# Patient Record
Sex: Male | Born: 1970 | Race: White | Hispanic: No | Smoking: Never smoker
Health system: Southern US, Community
[De-identification: ages and names within clinical notes are randomized; demographics above are authoritative.]

## PROBLEM LIST (undated history)

## (undated) DIAGNOSIS — E785 Hyperlipidemia, unspecified: Secondary | ICD-10-CM

## (undated) DIAGNOSIS — I10 Essential (primary) hypertension: Secondary | ICD-10-CM

## (undated) DIAGNOSIS — E039 Hypothyroidism, unspecified: Secondary | ICD-10-CM

## (undated) DIAGNOSIS — K219 Gastro-esophageal reflux disease without esophagitis: Secondary | ICD-10-CM

## (undated) DIAGNOSIS — E079 Disorder of thyroid, unspecified: Secondary | ICD-10-CM

## (undated) DIAGNOSIS — E669 Obesity, unspecified: Secondary | ICD-10-CM

## (undated) HISTORY — DX: Essential (primary) hypertension: I10

## (undated) HISTORY — DX: Disorder of thyroid, unspecified: E07.9

## (undated) HISTORY — DX: Gastro-esophageal reflux disease without esophagitis: K21.9

## (undated) HISTORY — DX: Hypothyroidism, unspecified: E03.9

## (undated) HISTORY — DX: Obesity, unspecified: E66.9

## (undated) HISTORY — DX: Hyperlipidemia, unspecified: E78.5

## (undated) HISTORY — PX: OTHER SURGICAL HISTORY: SHX169

---

## 2012-10-16 ENCOUNTER — Ambulatory Visit (INDEPENDENT_AMBULATORY_CARE_PROVIDER_SITE_OTHER): Payer: 59

## 2012-10-16 ENCOUNTER — Ambulatory Visit (INDEPENDENT_AMBULATORY_CARE_PROVIDER_SITE_OTHER): Payer: 59 | Admitting: Family Medicine

## 2012-10-16 ENCOUNTER — Telehealth: Payer: Self-pay | Admitting: Physician Assistant

## 2012-10-16 ENCOUNTER — Encounter: Payer: Self-pay | Admitting: Family Medicine

## 2012-10-16 VITALS — BP 116/74 | HR 65 | Temp 97.4°F | Ht 72.0 in | Wt 245.6 lb

## 2012-10-16 DIAGNOSIS — R059 Cough, unspecified: Secondary | ICD-10-CM | POA: Insufficient documentation

## 2012-10-16 DIAGNOSIS — K219 Gastro-esophageal reflux disease without esophagitis: Secondary | ICD-10-CM | POA: Insufficient documentation

## 2012-10-16 DIAGNOSIS — R05 Cough: Secondary | ICD-10-CM | POA: Insufficient documentation

## 2012-10-16 DIAGNOSIS — R062 Wheezing: Secondary | ICD-10-CM

## 2012-10-16 MED ORDER — BUDESONIDE-FORMOTEROL FUMARATE 160-4.5 MCG/ACT IN AERO: 2.0000 | INHALATION_SPRAY | Freq: Two times a day (BID) | RESPIRATORY_TRACT | Status: DC

## 2012-10-16 MED ORDER — PANTOPRAZOLE SODIUM 40 MG PO TBEC: 40.0000 mg | DELAYED_RELEASE_TABLET | Freq: Every day | ORAL | Status: DC

## 2012-10-16 NOTE — Telephone Encounter (Signed)
APPT MADE

## 2012-10-16 NOTE — Assessment & Plan Note (Signed)
Not controlled on present on Prilosec. Will change therapy.

## 2012-10-16 NOTE — Progress Notes (Signed)
Patient ID: Steve Conner, male   DOB: [birth year], 42 y.o.   MRN: [id number] SUBJECTIVE:   HPI: Cough Patient complains of nonproductive cough. Symptoms began a few days ago. Symptoms have been gradually worsening since that time.The cough is nonproductive and is aggravated by nothing. Associated symptoms include: heartburn. Patient does not have new pets. Patient does not have a history of asthma. Patient does not have a history of environmental allergens. Patient has not traveled recently. Patient does not have a history of smoking. Patient has not had a previous chest x-ray. Patient has not had a PPD done.  PMH/PSH: reviewed/updated in Epic  SH/FH: reviewed/updated in Epic  Allergies: reviewed/updated in Epic  Medications: reviewed/updated in Epic  Immunizations: reviewed/updated in Epic  ROS: Nil else.  OBJECTIVE:    On examination he appeared in good health and spirits. No distress. Anicteric, Acyanotic Vital signs as documented. BP 116/74  Pulse 65  Temp(Src) 97.4 F (36.3 C) (Oral)  Ht 6' (1.829 m)  Wt 245 lb 9.6 oz (111.403 kg)  BMI 33.3 kg/m2  SpO2 97%  Skin warm and dry and without overt rashes.  Head,Ears,Eyes,Throat: normal Neck without JVD.  Lungs :Coarse breath sounds.Scattered Expiratory wheezes. Good airflow. No Rales. Heart exam notable for regular rhythm, normal sounds and absence of murmurs, rubs or gallops. Abdomen unremarkable and without evidence of organomegaly, masses, or abdominal aortic enlargement. GU: Extremities nonedematous. Neurologic:nonfocal  ASSESSMENT:  Wheezing May be secondary to GERD Will treat with symbicort inhaler.  Cough Nocturnal cough suspicious for GERD.  GERD (gastroesophageal reflux disease) Not controlled on present on Prilosec. Will change therapy.    PLAN:  Orders Placed This Encounter  Procedures  . DG Chest 2 View    Standing Status: Future     Number of Occurrences: 1     Standing Expiration Date: 12/16/2013     Order Specific Question:  Reason for Exam (SYMPTOM  OR DIAGNOSIS REQUIRED)    Answer:  cough    Order Specific Question:  Preferred imaging location?    Answer:  Internal             [facility] reading (PRIMARY) by  Dr. Leodis Sias: Preliminary reading, peribronchial thickening, bronchitis.                                                Meds ordered this encounter  Medications  . DISCONTD: omeprazole (PRILOSEC OTC) 20 MG tablet    Sig: Take 20 mg by mouth daily.  . pantoprazole (PROTONIX) 40 MG tablet    Sig: Take 1 tablet (40 mg total) by mouth daily.    Dispense:  30 tablet    Refill:  3  . budesonide-formoterol (SYMBICORT) 160-4.5 MCG/ACT inhaler    Sig: Inhale 2 puffs into the lungs 2 (two) times daily.    Dispense:  1 Inhaler    Refill:  2  Discussed with patient about the aggressive treatment of GERD. Handout in AVS. GERD handout given. Demonstrated use of the inhaler.  RTC in 4 weeks for  Recheck and possible PFTs.  Truc Winfree P. Modesto Charon, M.D.

## 2012-10-16 NOTE — Patient Instructions (Signed)
Diet for Gastroesophageal Reflux Disease, Adult  Reflux (acid reflux) is when acid from your stomach flows up into the esophagus. When acid comes in contact with the esophagus, the acid causes irritation and soreness (inflammation) in the esophagus. When reflux happens often or so severely that it causes damage to the esophagus, it is called gastroesophageal reflux disease (GERD). Nutrition therapy can help ease the discomfort of GERD.  FOODS OR DRINKS TO AVOID OR LIMIT  · Smoking or chewing tobacco. Nicotine is one of the most potent stimulants to acid production in the gastrointestinal tract.  · Caffeinated and decaffeinated coffee and black tea.  · Regular or low-calorie carbonated beverages or energy drinks (caffeine-free carbonated beverages are allowed).    · Strong spices, such as black pepper, white pepper, red pepper, cayenne, curry powder, and chili powder.  · Peppermint or spearmint.  · Chocolate.  · High-fat foods, including meats and fried foods. Extra added fats including oils, butter, salad dressings, and nuts. Limit these to less than 8 tsp per day.  · Fruits and vegetables if they are not tolerated, such as citrus fruits or tomatoes.  · Alcohol.  · Any food that seems to aggravate your condition.  If you have questions regarding your diet, call your caregiver or a registered dietitian.  OTHER THINGS THAT MAY HELP GERD INCLUDE:   · Eating your meals slowly, in a relaxed setting.  · Eating 5 to 6 small meals per day instead of 3 large meals.  · Eliminating food for a period of time if it causes distress.  · Not lying down until 3 hours after eating a meal.  · Keeping the head of your bed raised 6 to 9 inches (15 to 23 cm) by using a foam wedge or blocks under the legs of the bed. Lying flat may make symptoms worse.  · Being physically active. Weight loss may be helpful in reducing reflux in overweight or obese adults.  · Wear loose fitting clothing  EXAMPLE MEAL PLAN  This meal plan is approximately  2,000 calories based on [employer]My[employer].gov meal planning guidelines.  Breakfast  · ½ cup cooked oatmeal.  · 1 cup strawberries.  · 1 cup low-fat milk.  · 1 oz almonds.  Snack  · 1 cup cucumber slices.  · 6 oz yogurt (made from low-fat or fat-free milk).  Lunch  · 2 slice whole-wheat bread.  · 2½ oz sliced turkey.  · 2 tsp mayonnaise.  · 1 cup blueberries.  · 1 cup snap peas.  Snack  · 6 whole-wheat crackers.  · 1 oz string cheese.  Dinner  · ½ cup brown rice.  · 1 cup mixed veggies.  · 1 tsp olive oil.  · 3 oz grilled fish.  Document Released: 07/01/2005 Document Revised: 09/23/2011 Document Reviewed: 05/17/2011  ExitCare® Patient Information ©2013 [employer].  Gastroesophageal Reflux Disease, Adult  Gastroesophageal reflux disease (GERD) happens when acid from your stomach flows up into the esophagus. When acid comes in contact with the esophagus, the acid causes soreness (inflammation) in the esophagus. Over time, GERD may create small holes (ulcers) in the lining of the esophagus.  CAUSES   · Increased body weight. This puts pressure on the stomach, making acid rise from the stomach into the esophagus.  · Smoking. This increases acid production in the stomach.  · Drinking alcohol. This causes decreased pressure in the lower esophageal sphincter (valve or ring of muscle between the esophagus and stomach), allowing acid from the stomach   into the esophagus.  · Late evening meals and a full stomach. This increases pressure and acid production in the stomach.  · A malformed lower esophageal sphincter.  Sometimes, no cause is found.  SYMPTOMS   · Burning pain in the lower part of the mid-chest behind the breastbone and in the mid-stomach area. This may occur twice a week or more often.  · Trouble swallowing.  · Sore throat.  · Dry cough.  · Asthma-like symptoms including chest tightness, shortness of breath, or wheezing.  DIAGNOSIS   Your caregiver may be able to diagnose GERD based on your symptoms. In some cases,  X-rays and other tests may be done to check for complications or to check the condition of your stomach and esophagus.  TREATMENT   Your caregiver may recommend over-the-counter or prescription medicines to help decrease acid production. Ask your caregiver before starting or adding any new medicines.   HOME CARE INSTRUCTIONS   · Change the factors that you can control. Ask your caregiver for guidance concerning weight loss, quitting smoking, and alcohol consumption.  · Avoid foods and drinks that make your symptoms worse, such as:  · Caffeine or alcoholic drinks.  · Chocolate.  · Peppermint or mint flavorings.  · Garlic and onions.  · Spicy foods.  · Citrus fruits, such as oranges, lemons, or limes.  · Tomato-based foods such as sauce, chili, salsa, and pizza.  · Fried and fatty foods.  · Avoid lying down for the 3 hours prior to your bedtime or prior to taking a nap.  · Eat small, frequent meals instead of large meals.  · Wear loose-fitting clothing. Do not wear anything tight around your waist that causes pressure on your stomach.  · Raise the head of your bed 6 to 8 inches with wood blocks to help you sleep. Extra pillows will not help.  · Only take over-the-counter or prescription medicines for pain, discomfort, or fever as directed by your caregiver.  · Do not take aspirin, ibuprofen, or other nonsteroidal anti-inflammatory drugs (NSAIDs).  SEEK IMMEDIATE MEDICAL CARE IF:   · You have pain in your arms, neck, jaw, teeth, or back.  · Your pain increases or changes in intensity or duration.  · You develop nausea, vomiting, or sweating (diaphoresis).  · You develop shortness of breath, or you faint.  · Your vomit is green, yellow, black, or looks like coffee grounds or blood.  · Your stool is red, bloody, or black.  These symptoms could be signs of other problems, such as heart disease, gastric bleeding, or esophageal bleeding.  MAKE SURE YOU:   · Understand these instructions.  · Will watch your  condition.  · Will get help right away if you are not doing well or get worse.  Document Released: 04/10/2005 Document Revised: 09/23/2011 Document Reviewed: 01/18/2011  ExitCare® Patient Information ©2013 [employer].

## 2012-10-16 NOTE — Assessment & Plan Note (Signed)
Nocturnal cough suspicious for GERD.

## 2012-10-16 NOTE — Assessment & Plan Note (Signed)
May be secondary to GERD Will treat with symbicort inhaler.

## 2012-11-17 ENCOUNTER — Ambulatory Visit: Payer: PRIVATE HEALTH INSURANCE | Admitting: Family Medicine

## 2015-10-10 ENCOUNTER — Encounter: Payer: Self-pay | Admitting: Family

## 2015-10-10 ENCOUNTER — Ambulatory Visit (INDEPENDENT_AMBULATORY_CARE_PROVIDER_SITE_OTHER): Payer: BLUE CROSS/BLUE SHIELD | Admitting: Family

## 2015-10-10 VITALS — BP 151/101 | HR 71 | Temp 97.6°F | Ht 72.0 in | Wt 248.4 lb

## 2015-10-10 DIAGNOSIS — I10 Essential (primary) hypertension: Secondary | ICD-10-CM | POA: Insufficient documentation

## 2015-10-10 DIAGNOSIS — Z114 Encounter for screening for human immunodeficiency virus [HIV]: Secondary | ICD-10-CM

## 2015-10-10 DIAGNOSIS — Z0000 Encounter for general adult medical examination without abnormal findings: Secondary | ICD-10-CM | POA: Diagnosis not present

## 2015-10-10 MED ORDER — LISINOPRIL-HYDROCHLOROTHIAZIDE 20-12.5 MG PO TABS: 1.0000 | ORAL_TABLET | Freq: Every day | ORAL | Status: DC

## 2015-10-10 NOTE — Progress Notes (Signed)
   Subjective:    Patient ID: Steve Conner, male    DOB: 07-Jul-1971, 45 y.o.   MRN: [id number]  Pt presents to the office today for CPE. PT states he was told at the Urgent Care that his BP was elevated.  Hypertension This is a new problem. The current episode started more than 1 month ago. The problem is unchanged. The problem is uncontrolled. Pertinent negatives include no anxiety, headaches, palpitations, peripheral edema or shortness of breath. Risk factors for coronary artery disease include male gender, obesity, sedentary lifestyle and family history. Past treatments include nothing. There is no history of kidney disease, CAD/MI, heart failure or a thyroid problem. There is no history of sleep apnea.      Review of Systems  Constitutional: Negative.   HENT: Negative.   Respiratory: Negative.  Negative for shortness of breath.   Cardiovascular: Negative.  Negative for palpitations.  Gastrointestinal: Negative.   Endocrine: Negative.   Genitourinary: Negative.   Musculoskeletal: Negative.   Neurological: Negative.  Negative for headaches.  Hematological: Negative.   Psychiatric/Behavioral: Negative.   All other systems reviewed and are negative.      Objective:   Physical Exam  Constitutional: He is oriented to person, place, and time. He appears well-developed and well-nourished. No distress.  HENT:  Head: Normocephalic.  Right Ear: External ear normal.  Left Ear: External ear normal.  Nose: Nose normal.  Mouth/Throat: Oropharynx is clear and moist.  Eyes: Pupils are equal, round, and reactive to light. Right eye exhibits no discharge. Left eye exhibits no discharge.  Neck: Normal range of motion. Neck supple. No thyromegaly present.  Cardiovascular: Normal rate, regular rhythm, normal heart sounds and intact distal pulses.   No murmur heard. Pulmonary/Chest: Effort normal and breath sounds normal. No respiratory distress. He has no wheezes.  Abdominal: Soft. Bowel sounds  are normal. He exhibits no distension. There is no tenderness.  Musculoskeletal: Normal range of motion. He exhibits no edema or tenderness.  Neurological: He is alert and oriented to person, place, and time. He has normal reflexes. No cranial nerve deficit.  Skin: Skin is warm and dry. No rash noted. No erythema.  Psychiatric: He has a normal mood and affect. His behavior is normal. Judgment and thought content normal.  Vitals reviewed.    BP 151/101 mmHg  Pulse 71  Temp(Src) 97.6 F (36.4 C) (Oral)  Ht 6' (1.829 m)  Wt 248 lb 6.4 oz (112.674 kg)  BMI 33.68 kg/m2      Assessment & Plan:  1. Essential hypertension -Pt started on Zestoretic 20-12 mg today -Dash diet information given -Exercise encouraged - Stress Management  -Continue current meds -RTO in 2 weeks  - CMP14+EGFR - lisinopril-hydrochlorothiazide (ZESTORETIC) 20-12.5 MG tablet; Take 1 tablet by mouth daily.  Dispense: 90 tablet; Refill: 3  2. Annual physical exam - Anemia Profile B - CMP14+EGFR - Lipid panel - Thyroid Panel With TSH - PSA, total and free - VITAMIN D 25 Hydroxy (Vit-D Deficiency, Fractures) - HIV antibody  3. Screening for HIV (human immunodeficiency virus) - HIV antibody   Continue all meds Labs pending Health Maintenance reviewed Diet and exercise encouraged RTO 2 week to recheck HTN  Evelina Dun, [name]

## 2015-10-10 NOTE — Patient Instructions (Signed)

## 2015-10-11 LAB — CMP14+EGFR
ALT: 36 IU/L (ref 0–44)
AST: 20 IU/L (ref 0–40)
Albumin/Globulin Ratio: 1.6 (ref 1.2–2.2)
Albumin: 4.3 g/dL (ref 3.5–5.5)
Alkaline Phosphatase: 110 IU/L (ref 39–117)
BUN/Creatinine Ratio: 20 (ref 9–20)
BUN: 18 mg/dL (ref 6–24)
Bilirubin Total: 0.4 mg/dL (ref 0.0–1.2)
CALCIUM: 9.1 mg/dL (ref 8.7–10.2)
CO2: 22 mmol/L (ref 18–29)
CREATININE: 0.92 mg/dL (ref 0.76–1.27)
Chloride: 100 mmol/L (ref 96–106)
GFR calc Af Amer: 118 mL/min/{1.73_m2} (ref >59)
GFR, EST NON AFRICAN AMERICAN: 102 mL/min/{1.73_m2} (ref >59)
Globulin, Total: 2.7 g/dL (ref 1.5–4.5)
Glucose: 102 mg/dL — ABNORMAL HIGH (ref 65–99)
Potassium: 3.7 mmol/L (ref 3.5–5.2)
Sodium: 142 mmol/L (ref 134–144)
Total Protein: 7 g/dL (ref 6.0–8.5)

## 2015-10-11 LAB — ANEMIA PROFILE B
Basophils Absolute: 0 10*3/uL (ref 0.0–0.2)
Basos: 0 %
EOS (ABSOLUTE): 0.2 10*3/uL (ref 0.0–0.4)
EOS: 3 %
Ferritin: 116 ng/mL (ref 30–400)
Folate: 12.2 ng/mL (ref >3.0)
HEMOGLOBIN: 13 g/dL (ref 12.6–17.7)
Hematocrit: 37.2 % — ABNORMAL LOW (ref 37.5–51.0)
IMMATURE GRANS (ABS): 0 10*3/uL (ref 0.0–0.1)
IMMATURE GRANULOCYTES: 0 %
IRON SATURATION: 13 % — AB (ref 15–55)
Iron: 50 ug/dL (ref 38–169)
Lymphocytes Absolute: 2.2 10*3/uL (ref 0.7–3.1)
Lymphs: 27 %
MCH: 27.5 pg (ref 26.6–33.0)
MCHC: 34.9 g/dL (ref 31.5–35.7)
MCV: 79 fL (ref 79–97)
MONOCYTES: 7 %
MONOS ABS: 0.6 10*3/uL (ref 0.1–0.9)
Neutrophils Absolute: 4.9 10*3/uL (ref 1.4–7.0)
Neutrophils: 63 %
Platelets: 205 10*3/uL (ref 150–379)
RBC: 4.73 x10E6/uL (ref 4.14–5.80)
RDW: 15.1 % (ref 12.3–15.4)
Retic Ct Pct: 1.9 % (ref 0.6–2.6)
TIBC: 381 ug/dL (ref 250–450)
UIBC: 331 ug/dL (ref 111–343)
Vitamin B-12: 712 pg/mL (ref 211–946)
WBC: 7.9 10*3/uL (ref 3.4–10.8)

## 2015-10-11 LAB — PSA, TOTAL AND FREE
PSA FREE PCT: 16.2 %
PSA, Free: 0.21 ng/mL
Prostate Specific Ag, Serum: 1.3 ng/mL (ref 0.0–4.0)

## 2015-10-11 LAB — VITAMIN D 25 HYDROXY (VIT D DEFICIENCY, FRACTURES): VIT D 25 HYDROXY: 19.9 ng/mL — AB (ref 30.0–100.0)

## 2015-10-11 LAB — LIPID PANEL
CHOL/HDL RATIO: 13.2 ratio — AB (ref 0.0–5.0)
Cholesterol, Total: 225 mg/dL — ABNORMAL HIGH (ref 100–199)
HDL: 17 mg/dL — AB (ref >39)
TRIGLYCERIDES: 969 mg/dL — AB (ref 0–149)

## 2015-10-11 LAB — THYROID PANEL WITH TSH
Free Thyroxine Index: 1.3 (ref 1.2–4.9)
T3 Uptake Ratio: 25 % (ref 24–39)
T4, Total: 5.3 ug/dL (ref 4.5–12.0)
TSH: 4.59 u[IU]/mL — ABNORMAL HIGH (ref 0.450–4.500)

## 2015-10-11 LAB — HIV ANTIBODY (ROUTINE TESTING W REFLEX): HIV Screen 4th Generation wRfx: NONREACTIVE

## 2015-10-12 ENCOUNTER — Other Ambulatory Visit: Payer: Self-pay | Admitting: Family

## 2015-10-12 DIAGNOSIS — E039 Hypothyroidism, unspecified: Secondary | ICD-10-CM | POA: Insufficient documentation

## 2015-10-12 DIAGNOSIS — E559 Vitamin D deficiency, unspecified: Secondary | ICD-10-CM | POA: Insufficient documentation

## 2015-10-12 DIAGNOSIS — E781 Pure hyperglyceridemia: Secondary | ICD-10-CM | POA: Insufficient documentation

## 2015-10-12 MED ORDER — LEVOTHYROXINE SODIUM 25 MCG PO TABS: 25.0000 ug | ORAL_TABLET | Freq: Every day | ORAL | Status: DC

## 2015-10-12 MED ORDER — ROSUVASTATIN CALCIUM 20 MG PO TABS: 20.0000 mg | ORAL_TABLET | Freq: Every day | ORAL | Status: DC

## 2015-10-12 MED ORDER — VITAMIN D (ERGOCALCIFEROL) 1.25 MG (50000 UNIT) PO CAPS: 50000.0000 [IU] | ORAL_CAPSULE | ORAL | Status: DC

## 2015-10-24 ENCOUNTER — Encounter: Payer: Self-pay | Admitting: *Deleted

## 2015-10-24 ENCOUNTER — Ambulatory Visit (INDEPENDENT_AMBULATORY_CARE_PROVIDER_SITE_OTHER): Payer: BLUE CROSS/BLUE SHIELD | Admitting: Family

## 2015-10-24 ENCOUNTER — Encounter: Payer: Self-pay | Admitting: Family

## 2015-10-24 VITALS — BP 127/80 | HR 64 | Temp 97.8°F | Ht 72.0 in | Wt 245.0 lb

## 2015-10-24 DIAGNOSIS — E669 Obesity, unspecified: Secondary | ICD-10-CM

## 2015-10-24 DIAGNOSIS — I10 Essential (primary) hypertension: Secondary | ICD-10-CM | POA: Diagnosis not present

## 2015-10-24 DIAGNOSIS — E66811 Obesity, class 1: Secondary | ICD-10-CM

## 2015-10-24 HISTORY — DX: Obesity, unspecified: E66.9

## 2015-10-24 HISTORY — DX: Obesity, class 1: E66.811

## 2015-10-24 NOTE — Progress Notes (Signed)
   Subjective:    Patient ID: Steve Conner, male    DOB: [birth year], 45 y.o.   MRN: [id number]  Pt presents to the office today to recheck HTN. Pt's BP is at goal today. Hypertension This is a chronic problem. The current episode started more than 1 year ago. The problem has been resolved since onset. The problem is controlled. Pertinent negatives include no anxiety, headaches, palpitations, peripheral edema or shortness of breath. Risk factors for coronary artery disease include dyslipidemia, obesity and smoking/tobacco exposure. Past treatments include ACE inhibitors and diuretics. The current treatment provides moderate improvement. There is no history of kidney disease, CAD/MI, heart failure, PVD or a thyroid problem. There is no history of sleep apnea.      Review of Systems  Constitutional: Negative.   HENT: Negative.   Respiratory: Negative.  Negative for shortness of breath.   Cardiovascular: Negative.  Negative for palpitations.  Gastrointestinal: Negative.   Endocrine: Negative.   Genitourinary: Negative.   Musculoskeletal: Negative.   Neurological: Negative.  Negative for headaches.  Hematological: Negative.   Psychiatric/Behavioral: Negative.   All other systems reviewed and are negative.      Objective:   Physical Exam  Constitutional: He is oriented to person, place, and time. He appears well-developed and well-nourished. No distress.  HENT:  Head: Normocephalic.  Right Ear: External ear normal.  Left Ear: External ear normal.  Nose: Nose normal.  Mouth/Throat: Oropharynx is clear and moist.  Eyes: Pupils are equal, round, and reactive to light. Right eye exhibits no discharge. Left eye exhibits no discharge.  Neck: Normal range of motion. Neck supple. No thyromegaly present.  Cardiovascular: Normal rate, regular rhythm, normal heart sounds and intact distal pulses.   No murmur heard. Pulmonary/Chest: Effort normal and breath sounds normal. No respiratory  distress. He has no wheezes.  Abdominal: Soft. Bowel sounds are normal. He exhibits no distension. There is no tenderness.  Musculoskeletal: Normal range of motion. He exhibits no edema or tenderness.  Neurological: He is alert and oriented to person, place, and time. He has normal reflexes. No cranial nerve deficit.  Skin: Skin is warm and dry. No rash noted. No erythema.  Psychiatric: He has a normal mood and affect. His behavior is normal. Judgment and thought content normal.  Vitals reviewed.    BP 127/80 mmHg  Pulse 64  Temp(Src) 97.8 F (36.6 C) (Oral)  Ht 6' (1.829 m)  Wt 245 lb (111.131 kg)  BMI 33.22 kg/m2      Assessment & Plan:  1. Essential hypertension -Dash diet information given -Exercise encouraged - Stress Management  -Continue current meds - BMP8+EGFR  2. Obesity (BMI 30.0-34.9)   PT to RTO in 2 months to recheck thyroid and cholesterol levels.  Evelina Dun, [name]

## 2015-10-24 NOTE — Patient Instructions (Signed)
DASH Eating Plan °DASH stands for "Dietary Approaches to Stop Hypertension." The DASH eating plan is a healthy eating plan that has been shown to reduce high blood pressure (hypertension). Additional health benefits may include reducing the risk of type 2 diabetes mellitus, heart disease, and stroke. The DASH eating plan may also help with weight loss. °WHAT DO I NEED TO KNOW ABOUT THE DASH EATING PLAN? °For the DASH eating plan, you will follow these general guidelines: °· Choose foods with a percent daily value for sodium of less than 5% (as listed on the food label). °· Use salt-free seasonings or herbs instead of table salt or sea salt. °· Check with your health care provider or pharmacist before using salt substitutes. °· Eat lower-sodium products, often labeled as "lower sodium" or "no salt added." °· Eat fresh foods. °· Eat more vegetables, fruits, and low-fat dairy products. °· Choose whole grains. Look for the word "whole" as the first word in the ingredient list. °· Choose fish and skinless chicken or turkey more often than red meat. Limit fish, poultry, and meat to 6 oz (170 g) each day. °· Limit sweets, desserts, sugars, and sugary drinks. °· Choose heart-healthy fats. °· Limit cheese to 1 oz (28 g) per day. °· Eat more home-cooked food and less restaurant, buffet, and fast food. °· Limit fried foods. °· Cook foods using methods other than frying. °· Limit canned vegetables. If you do use them, rinse them well to decrease the sodium. °· When eating at a restaurant, ask that your food be prepared with less salt, or no salt if possible. °WHAT FOODS CAN I EAT? °Seek help from a dietitian for individual calorie needs. °Grains °Whole grain or whole wheat bread. Brown rice. Whole grain or whole wheat pasta. Quinoa, bulgur, and whole grain cereals. Low-sodium cereals. Corn or whole wheat flour tortillas. Whole grain cornbread. Whole grain crackers. Low-sodium crackers. °Vegetables °Fresh or frozen vegetables  (raw, steamed, roasted, or grilled). Low-sodium or reduced-sodium tomato and vegetable juices. Low-sodium or reduced-sodium tomato sauce and paste. Low-sodium or reduced-sodium canned vegetables.  °Fruits °All fresh, canned (in natural juice), or frozen fruits. °Meat and Other Protein Products °Ground beef (85% or leaner), grass-fed beef, or beef trimmed of fat. Skinless chicken or turkey. Ground chicken or turkey. Pork trimmed of fat. All fish and seafood. Eggs. Dried beans, peas, or lentils. Unsalted nuts and seeds. Unsalted canned beans. °Dairy °Low-fat dairy products, such as skim or 1% milk, 2% or reduced-fat cheeses, low-fat ricotta or cottage cheese, or plain low-fat yogurt. Low-sodium or reduced-sodium cheeses. °Fats and Oils °Tub margarines without trans fats. Light or reduced-fat mayonnaise and salad dressings (reduced sodium). Avocado. Safflower, olive, or canola oils. Natural peanut or almond butter. °Other °Unsalted popcorn and pretzels. °The items listed above may not be a complete list of recommended foods or beverages. Contact your dietitian for more options. °WHAT FOODS ARE NOT RECOMMENDED? °Grains °White bread. White pasta. White rice. Refined cornbread. Bagels and croissants. Crackers that contain trans fat. °Vegetables °Creamed or fried vegetables. Vegetables in a cheese sauce. Regular canned vegetables. Regular canned tomato sauce and paste. Regular tomato and vegetable juices. °Fruits °Dried fruits. Canned fruit in light or heavy syrup. Fruit juice. °Meat and Other Protein Products °Fatty cuts of meat. Ribs, chicken wings, bacon, sausage, bologna, salami, chitterlings, fatback, hot dogs, bratwurst, and packaged luncheon meats. Salted nuts and seeds. Canned beans with salt. °Dairy °Whole or 2% milk, cream, half-and-half, and cream cheese. Whole-fat or sweetened yogurt. Full-fat   cheeses or blue cheese. Nondairy creamers and whipped toppings. Processed cheese, cheese spreads, or cheese  curds. °Condiments °Onion and garlic salt, seasoned salt, table salt, and sea salt. Canned and packaged gravies. Worcestershire sauce. Tartar sauce. Barbecue sauce. Teriyaki sauce. Soy sauce, including reduced sodium. Steak sauce. Fish sauce. Oyster sauce. Cocktail sauce. Horseradish. Ketchup and mustard. Meat flavorings and tenderizers. Bouillon cubes. Hot sauce. Tabasco sauce. Marinades. Taco seasonings. Relishes. °Fats and Oils °Butter, stick margarine, lard, shortening, ghee, and bacon fat. Coconut, palm kernel, or palm oils. Regular salad dressings. °Other °Pickles and olives. Salted popcorn and pretzels. °The items listed above may not be a complete list of foods and beverages to avoid. Contact your dietitian for more information. °WHERE CAN I FIND MORE INFORMATION? °[employer]: www.nhlbi.nih.gov/health/health-topics/topics/dash/ °  °This information is not intended to replace advice given to you by your health care provider. Make sure you discuss any questions you have with your health care provider. °  °Document Released: 06/20/2011 Document Revised: 07/22/2014 Document Reviewed: 05/05/2013 °Elsevier Interactive Patient Education ©2016 [employer] ° °Hypertension °Hypertension, commonly called high blood pressure, is when the force of blood pumping through your arteries is too strong. Your arteries are the blood vessels that carry blood from your heart throughout your body. A blood pressure reading consists of a higher number over a lower number, such as 110/72. The higher number (systolic) is the pressure inside your arteries when your heart pumps. The lower number (diastolic) is the pressure inside your arteries when your heart relaxes. Ideally you want your blood pressure below 120/80. °Hypertension forces your heart to work harder to pump blood. Your arteries may become narrow or stiff. Having untreated or uncontrolled hypertension can cause heart attack, stroke, kidney  disease, and other problems. °RISK FACTORS °Some risk factors for high blood pressure are controllable. Others are not.  °Risk factors you cannot control include:  °· Race. You may be at higher risk if you are African American. °· Age. Risk increases with age. °· Gender. Men are at higher risk than women before age 45 years. After age 65, women are at higher risk than men. °Risk factors you can control include: °· Not getting enough exercise or physical activity. °· Being overweight. °· Getting too much fat, sugar, calories, or salt in your diet. °· Drinking too much alcohol. °SIGNS AND SYMPTOMS °Hypertension does not usually cause signs or symptoms. Extremely high blood pressure (hypertensive crisis) may cause headache, anxiety, shortness of breath, and nosebleed. °DIAGNOSIS °To check if you have hypertension, your health care provider will measure your blood pressure while you are seated, with your arm held at the level of your heart. It should be measured at least twice using the same arm. Certain conditions can cause a difference in blood pressure between your right and left arms. A blood pressure reading that is higher than normal on one occasion does not mean that you need treatment. If it is not clear whether you have high blood pressure, you may be asked to return on a different day to have your blood pressure checked again. Or, you may be asked to monitor your blood pressure at home for 1 or more weeks. °TREATMENT °Treating high blood pressure includes making lifestyle changes and possibly taking medicine. Living a healthy lifestyle can help lower high blood pressure. You may need to change some of your habits. °Lifestyle changes may include: °· Following the DASH diet. This diet is high in fruits, vegetables, and whole   grains. It is low in salt, red meat, and added sugars. °· Keep your sodium intake below 2,300 mg per day. °· Getting at least 30-45 minutes of aerobic exercise at least 4 times per  week. °· Losing weight if necessary. °· Not smoking. °· Limiting alcoholic beverages. °· Learning ways to reduce stress. °Your health care provider may prescribe medicine if lifestyle changes are not enough to get your blood pressure under control, and if one of the following is true: °· You are 18-59 years of age and your systolic blood pressure is above 140. °· You are 60 years of age or older, and your systolic blood pressure is above 150. °· Your diastolic blood pressure is above 90. °· You have diabetes, and your systolic blood pressure is over 140 or your diastolic blood pressure is over 90. °· You have kidney disease and your blood pressure is above 140/90. °· You have heart disease and your blood pressure is above 140/90. °Your personal target blood pressure may vary depending on your medical conditions, your age, and other factors. °HOME CARE INSTRUCTIONS °· Have your blood pressure rechecked as directed by your health care provider.   °· Take medicines only as directed by your health care provider. Follow the directions carefully. Blood pressure medicines must be taken as prescribed. The medicine does not work as well when you skip doses. Skipping doses also puts you at risk for problems. °· Do not smoke.   °· Monitor your blood pressure at home as directed by your health care provider.  °SEEK MEDICAL CARE IF:  °· You think you are having a reaction to medicines taken. °· You have recurrent headaches or feel dizzy. °· You have swelling in your ankles. °· You have trouble with your vision. °SEEK IMMEDIATE MEDICAL CARE IF: °· You develop a severe headache or confusion. °· You have unusual weakness, numbness, or feel faint. °· You have severe chest or abdominal pain. °· You vomit repeatedly. °· You have trouble breathing. °MAKE SURE YOU:  °· Understand these instructions. °· Will watch your condition. °· Will get help right away if you are not doing well or get worse. °  °This information is not intended to  replace advice given to you by your health care provider. Make sure you discuss any questions you have with your health care provider. °  °Document Released: 07/01/2005 Document Revised: 11/15/2014 Document Reviewed: 04/23/2013 °Elsevier Interactive Patient Education ©2016 Elsevier Inc. ° °

## 2015-10-25 LAB — BMP8+EGFR
BUN / CREAT RATIO: 21 — AB (ref 9–20)
BUN: 21 mg/dL (ref 6–24)
CO2: 25 mmol/L (ref 18–29)
CREATININE: 0.99 mg/dL (ref 0.76–1.27)
Calcium: 9.6 mg/dL (ref 8.7–10.2)
Chloride: 99 mmol/L (ref 96–106)
GFR, EST AFRICAN AMERICAN: 108 mL/min/{1.73_m2} (ref >59)
GFR, EST NON AFRICAN AMERICAN: 94 mL/min/{1.73_m2} (ref >59)
Glucose: 85 mg/dL (ref 65–99)
POTASSIUM: 4 mmol/L (ref 3.5–5.2)
SODIUM: 142 mmol/L (ref 134–144)

## 2015-12-26 ENCOUNTER — Encounter: Payer: Self-pay | Admitting: Family

## 2015-12-26 ENCOUNTER — Ambulatory Visit (INDEPENDENT_AMBULATORY_CARE_PROVIDER_SITE_OTHER): Payer: BLUE CROSS/BLUE SHIELD | Admitting: Family

## 2015-12-26 VITALS — BP 126/86 | HR 66 | Temp 97.1°F | Ht 72.0 in | Wt 240.8 lb

## 2015-12-26 DIAGNOSIS — E559 Vitamin D deficiency, unspecified: Secondary | ICD-10-CM | POA: Diagnosis not present

## 2015-12-26 DIAGNOSIS — K219 Gastro-esophageal reflux disease without esophagitis: Secondary | ICD-10-CM

## 2015-12-26 DIAGNOSIS — E8881 Metabolic syndrome: Secondary | ICD-10-CM | POA: Diagnosis not present

## 2015-12-26 DIAGNOSIS — I10 Essential (primary) hypertension: Secondary | ICD-10-CM | POA: Diagnosis not present

## 2015-12-26 DIAGNOSIS — E669 Obesity, unspecified: Secondary | ICD-10-CM | POA: Diagnosis not present

## 2015-12-26 DIAGNOSIS — E039 Hypothyroidism, unspecified: Secondary | ICD-10-CM

## 2015-12-26 DIAGNOSIS — E781 Pure hyperglyceridemia: Secondary | ICD-10-CM

## 2015-12-26 NOTE — Progress Notes (Signed)
Subjective:    Patient ID: Steve Conner, male    DOB: 1970/08/23, 45 y.o.   MRN: [id number]  Pt presents to the office today for chronic follow up.  Hypertension This is a chronic problem. The current episode started more than 1 month ago. The problem has been resolved since onset. The problem is controlled. Pertinent negatives include no anxiety, headaches, palpitations, peripheral edema or shortness of breath. Risk factors for coronary artery disease include male gender, obesity, sedentary lifestyle and family history. Past treatments include ACE inhibitors and diuretics. There is no history of kidney disease, CAD/MI, heart failure or a thyroid problem. There is no history of sleep apnea.  Hyperlipidemia This is a chronic problem. The current episode started more than 1 month ago. The problem is uncontrolled. Recent lipid tests were reviewed and are high. He has no history of diabetes. Pertinent negatives include no shortness of breath. Current antihyperlipidemic treatment includes statins. The current treatment provides mild improvement of lipids. Risk factors for coronary artery disease include dyslipidemia, hypertension, male sex, obesity and a sedentary lifestyle.  Thyroid Problem Presents for follow-up visit. Patient reports no constipation, diarrhea, dry skin, fatigue, hoarse voice, leg swelling, palpitations or weight gain. Past treatments include levothyroxine. The treatment provided moderate relief. His past medical history is significant for hyperlipidemia. There is no history of diabetes or heart failure.  Gastroesophageal Reflux He reports no belching, no coughing, no heartburn or no hoarse voice. This is a chronic problem. The current episode started more than 1 year ago. The problem occurs rarely. The problem has been resolved. The symptoms are aggravated by lying down. Pertinent negatives include no fatigue. Risk factors include obesity. He has tried an antacid for the symptoms. The  treatment provided moderate relief.      Review of Systems  Constitutional: Negative.  Negative for weight gain and fatigue.  HENT: Negative.  Negative for hoarse voice.   Respiratory: Negative.  Negative for cough and shortness of breath.   Cardiovascular: Negative.  Negative for palpitations.  Gastrointestinal: Negative.  Negative for heartburn, diarrhea and constipation.  Endocrine: Negative.   Genitourinary: Negative.   Musculoskeletal: Negative.   Neurological: Negative.  Negative for headaches.  Hematological: Negative.   Psychiatric/Behavioral: Negative.   All other systems reviewed and are negative.      Objective:   Physical Exam  Constitutional: He is oriented to person, place, and time. He appears well-developed and well-nourished. No distress.  HENT:  Head: Normocephalic.  Right Ear: External ear normal.  Left Ear: External ear normal.  Nose: Nose normal.  Mouth/Throat: Oropharynx is clear and moist.  Eyes: Pupils are equal, round, and reactive to light. Right eye exhibits no discharge. Left eye exhibits no discharge.  Neck: Normal range of motion. Neck supple. No thyromegaly present.  Cardiovascular: Normal rate, regular rhythm, normal heart sounds and intact distal pulses.   No murmur heard. Pulmonary/Chest: Effort normal and breath sounds normal. No respiratory distress. He has no wheezes.  Abdominal: Soft. Bowel sounds are normal. He exhibits no distension. There is no tenderness.  Musculoskeletal: Normal range of motion. He exhibits no edema or tenderness.  Neurological: He is alert and oriented to person, place, and time. He has normal reflexes. No cranial nerve deficit.  Skin: Skin is warm and dry. No rash noted. No erythema.  Psychiatric: He has a normal mood and affect. His behavior is normal. Judgment and thought content normal.  Vitals reviewed.    BP 126/86 mmHg  Pulse 66  Temp(Src) 97.1 F (36.2 C) (Oral)  Ht 6' (1.829 m)  Wt 240 lb 12.8 oz  (109.226 kg)  BMI 32.65 kg/m2      Assessment & Plan:  1. Essential hypertension - CMP14+EGFR  2. Gastroesophageal reflux disease, esophagitis presence not specified - CMP14+EGFR  3. Hypothyroidism, unspecified hypothyroidism type - CMP14+EGFR - Thyroid Panel With TSH  4. Hypertriglyceridemia - CMP14+EGFR - Lipid panel  5. Vitamin D deficiency - CMP14+EGFR  6. Obesity (BMI 30.0-34.9) - HAL93+XTKW  7. Metabolic syndrome  - IOX73+ZHGD   Continue all meds Labs pending Health Maintenance reviewed Diet and exercise encouraged RTO 3 months  Evelina Dun, [name]

## 2015-12-26 NOTE — Patient Instructions (Signed)

## 2015-12-27 LAB — CMP14+EGFR
ALBUMIN: 4.4 g/dL (ref 3.5–5.5)
ALK PHOS: 106 IU/L (ref 39–117)
ALT: 54 IU/L — ABNORMAL HIGH (ref 0–44)
AST: 34 IU/L (ref 0–40)
Albumin/Globulin Ratio: 1.5 (ref 1.2–2.2)
BUN / CREAT RATIO: 19 (ref 9–20)
BUN: 18 mg/dL (ref 6–24)
Bilirubin Total: 0.4 mg/dL (ref 0.0–1.2)
CO2: 24 mmol/L (ref 18–29)
CREATININE: 0.96 mg/dL (ref 0.76–1.27)
Calcium: 9.3 mg/dL (ref 8.7–10.2)
Chloride: 98 mmol/L (ref 96–106)
GFR calc Af Amer: 112 mL/min/{1.73_m2} (ref >59)
GFR calc non Af Amer: 97 mL/min/{1.73_m2} (ref >59)
GLOBULIN, TOTAL: 3 g/dL (ref 1.5–4.5)
Glucose: 94 mg/dL (ref 65–99)
Potassium: 3.7 mmol/L (ref 3.5–5.2)
SODIUM: 141 mmol/L (ref 134–144)
Total Protein: 7.4 g/dL (ref 6.0–8.5)

## 2015-12-27 LAB — THYROID PANEL WITH TSH
Free Thyroxine Index: 1.4 (ref 1.2–4.9)
T3 UPTAKE RATIO: 24 % (ref 24–39)
T4 TOTAL: 5.8 ug/dL (ref 4.5–12.0)
TSH: 2.7 u[IU]/mL (ref 0.450–4.500)

## 2015-12-27 LAB — LIPID PANEL
CHOL/HDL RATIO: 9.4 ratio — AB (ref 0.0–5.0)
CHOLESTEROL TOTAL: 150 mg/dL (ref 100–199)
HDL: 16 mg/dL — ABNORMAL LOW (ref >39)
Triglycerides: 739 mg/dL (ref 0–149)

## 2015-12-28 ENCOUNTER — Other Ambulatory Visit: Payer: Self-pay | Admitting: Family

## 2015-12-28 MED ORDER — FENOFIBRATE 145 MG PO TABS: 145.0000 mg | ORAL_TABLET | Freq: Every day | ORAL | Status: DC

## 2016-01-09 NOTE — Addendum Note (Signed)
Addended by: Tamera PuntWRAY, Yari Szeliga S on: 01/09/2016 08:48 AM   Modules accepted: Orders

## 2016-01-25 ENCOUNTER — Ambulatory Visit (INDEPENDENT_AMBULATORY_CARE_PROVIDER_SITE_OTHER): Payer: BLUE CROSS/BLUE SHIELD

## 2016-01-25 DIAGNOSIS — E781 Pure hyperglyceridemia: Secondary | ICD-10-CM

## 2016-01-25 DIAGNOSIS — I10 Essential (primary) hypertension: Secondary | ICD-10-CM | POA: Diagnosis not present

## 2016-01-25 DIAGNOSIS — E8881 Metabolic syndrome: Secondary | ICD-10-CM | POA: Diagnosis not present

## 2016-01-27 LAB — EXERCISE TOLERANCE TEST
CHL RATE OF PERCEIVED EXERTION: 6
CSEPHR: 88 %
CSEPPHR: 157 {beats}/min
Estimated workload: 9.6 METS
Exercise duration (min): 8 min
Exercise duration (sec): 34 s
MPHR: 178 {beats}/min
Rest HR: 56 {beats}/min

## 2016-02-02 ENCOUNTER — Encounter: Payer: Self-pay | Admitting: Family

## 2016-02-19 ENCOUNTER — Encounter: Payer: Self-pay | Admitting: *Deleted

## 2016-03-28 ENCOUNTER — Encounter: Payer: Self-pay | Admitting: Family

## 2016-03-28 ENCOUNTER — Ambulatory Visit (INDEPENDENT_AMBULATORY_CARE_PROVIDER_SITE_OTHER): Payer: BLUE CROSS/BLUE SHIELD | Admitting: Family

## 2016-03-28 VITALS — BP 116/66 | HR 56 | Temp 97.5°F | Ht 72.0 in | Wt 233.2 lb

## 2016-03-28 DIAGNOSIS — I10 Essential (primary) hypertension: Secondary | ICD-10-CM | POA: Diagnosis not present

## 2016-03-28 DIAGNOSIS — E8881 Metabolic syndrome: Secondary | ICD-10-CM

## 2016-03-28 DIAGNOSIS — E66811 Obesity, class 1: Secondary | ICD-10-CM

## 2016-03-28 DIAGNOSIS — E781 Pure hyperglyceridemia: Secondary | ICD-10-CM | POA: Diagnosis not present

## 2016-03-28 DIAGNOSIS — E559 Vitamin D deficiency, unspecified: Secondary | ICD-10-CM | POA: Diagnosis not present

## 2016-03-28 DIAGNOSIS — K219 Gastro-esophageal reflux disease without esophagitis: Secondary | ICD-10-CM

## 2016-03-28 DIAGNOSIS — E039 Hypothyroidism, unspecified: Secondary | ICD-10-CM | POA: Diagnosis not present

## 2016-03-28 DIAGNOSIS — E669 Obesity, unspecified: Secondary | ICD-10-CM

## 2016-03-28 MED ORDER — PANTOPRAZOLE SODIUM 20 MG PO TBEC: 20.0000 mg | DELAYED_RELEASE_TABLET | Freq: Every day | ORAL | 1 refills | Status: DC

## 2016-03-28 NOTE — Patient Instructions (Addendum)
Gastroesophageal Reflux Disease, Adult Normally, food travels down the esophagus and stays in the stomach to be digested. However, when a person has gastroesophageal reflux disease (GERD), food and stomach acid move back up into the esophagus. When this happens, the esophagus becomes sore and inflamed. Over time, GERD can create small holes (ulcers) in the lining of the esophagus.  CAUSES This condition is caused by a problem with the muscle between the esophagus and the stomach (lower esophageal sphincter, or LES). Normally, the LES muscle closes after food passes through the esophagus to the stomach. When the LES is weakened or abnormal, it does not close properly, and that allows food and stomach acid to go back up into the esophagus. The LES can be weakened by certain dietary substances, medicines, and medical conditions, including:  Tobacco use.  Pregnancy.  Having a hiatal hernia.  Heavy alcohol use.  Certain foods and beverages, such as coffee, chocolate, onions, and peppermint. RISK FACTORS This condition is more likely to develop in:  People who have an increased body weight.  People who have connective tissue disorders.  People who use NSAID medicines. SYMPTOMS Symptoms of this condition include:  Heartburn.  Difficult or painful swallowing.  The feeling of having a lump in the throat.  Abitter taste in the mouth.  Bad breath.  Having a large amount of saliva.  Having an upset or bloated stomach.  Belching.  Chest pain.  Shortness of breath or wheezing.  Ongoing (chronic) cough or a night-time cough.  Wearing away of tooth enamel.  Weight loss. Different conditions can cause chest pain. Make sure to see your health care provider if you experience chest pain. DIAGNOSIS Your health care provider will take a medical history and perform a physical exam. To determine if you have mild or severe GERD, your health care provider may also monitor how you respond  to treatment. You may also have other tests, including:  An endoscopy toexamine your stomach and esophagus with a small camera.  A test thatmeasures the acidity level in your esophagus.  A test thatmeasures how much pressure is on your esophagus.  A barium swallow or modified barium swallow to show the shape, size, and functioning of your esophagus. TREATMENT The goal of treatment is to help relieve your symptoms and to prevent complications. Treatment for this condition may vary depending on how severe your symptoms are. Your health care provider may recommend:  Changes to your diet.  Medicine.  Surgery. HOME CARE INSTRUCTIONS Diet  Follow a diet as recommended by your health care provider. This may involve avoiding foods and drinks such as:  Coffee and tea (with or without caffeine).  Drinks that containalcohol.  Energy drinks and sports drinks.  Carbonated drinks or sodas.  Chocolate and cocoa.  Peppermint and mint flavorings.  Garlic and onions.  Horseradish.  Spicy and acidic foods, including peppers, chili powder, curry powder, vinegar, hot sauces, and barbecue sauce.  Citrus fruit juices and citrus fruits, such as oranges, lemons, and limes.  Tomato-based foods, such as red sauce, chili, salsa, and pizza with red sauce.  Fried and fatty foods, such as donuts, french fries, potato chips, and high-fat dressings.  High-fat meats, such as hot dogs and fatty cuts of red and white meats, such as rib eye steak, sausage, ham, and bacon.  High-fat dairy items, such as whole milk, butter, and cream cheese.  Eat small, frequent meals instead of large meals.  Avoid drinking large amounts of liquid with your   meals.  Avoid eating meals during the 2-3 hours before bedtime.  Avoid lying down right after you eat.  Do not exercise right after you eat. General Instructions  Pay attention to any changes in your symptoms.  Take over-the-counter and prescription  medicines only as told by your health care provider. Do not take aspirin, ibuprofen, or other NSAIDs unless your health care provider told you to do so.  Do not use any tobacco products, including cigarettes, chewing tobacco, and e-cigarettes. If you need help quitting, ask your health care provider.  Wear loose-fitting clothing. Do not wear anything tight around your waist that causes pressure on your abdomen.  Raise (elevate) the head of your bed 6 inches (15cm).  Try to reduce your stress, such as with yoga or meditation. If you need help reducing stress, ask your health care provider.  If you are overweight, reduce your weight to an amount that is healthy for you. Ask your health care provider for guidance about a safe weight loss goal.  Keep all follow-up visits as told by your health care provider. This is important. SEEK MEDICAL CARE IF:  You have new symptoms.  You have unexplained weight loss.  You have difficulty swallowing, or it hurts to swallow.  You have wheezing or a persistent cough.  Your symptoms do not improve with treatment.  You have a hoarse voice. SEEK IMMEDIATE MEDICAL CARE IF:  You have pain in your arms, neck, jaw, teeth, or back.  You feel sweaty, dizzy, or light-headed.  You have chest pain or shortness of breath.  You vomit and your vomit looks like blood or coffee grounds.  You faint.  Your stool is bloody or black.  You cannot swallow, drink, or eat.   This information is not intended to replace advice given to you by your health care provider. Make sure you discuss any questions you have with your health care provider.   Document Released: 04/10/2005 Document Revised: 03/22/2015 Document Reviewed: 10/26/2014 Elsevier Interactive Patient Education 2016 [employer] Inc[employer] Food Choices for Gastroesophageal Reflux Disease, Adult When you have gastroesophageal reflux disease (GERD), the foods you eat and your eating habits are very important.  Choosing the right foods can help ease the discomfort of GERD. WHAT GENERAL GUIDELINES DO I NEED TO FOLLOW?  Choose fruits, vegetables, whole grains, low-fat dairy products, and low-fat meat, fish, and poultry.  Limit fats such as oils, salad dressings, butter, nuts, and avocado.  Keep a food diary to identify foods that cause symptoms.  Avoid foods that cause reflux. These may be different for different people.  Eat frequent small meals instead of three large meals each day.  Eat your meals slowly, in a relaxed setting.  Limit fried foods.  Cook foods using methods other than frying.  Avoid drinking alcohol.  Avoid drinking large amounts of liquids with your meals.  Avoid bending over or lying down until 2-3 hours after eating. WHAT FOODS ARE NOT RECOMMENDED? The following are some foods and drinks that may worsen your symptoms: Vegetables Tomatoes. Tomato juice. Tomato and spaghetti sauce. Chili peppers. Onion and garlic. Horseradish. Fruits Oranges, grapefruit, and lemon (fruit and juice). Meats High-fat meats, fish, and poultry. This includes hot dogs, ribs, ham, sausage, salami, and bacon. Dairy Whole milk and chocolate milk. Sour cream. Cream. Butter. Ice cream. Cream cheese.  Beverages Coffee and tea, with or without caffeine. Carbonated beverages or energy drinks. Condiments Hot sauce. Barbecue sauce.  Sweets/Desserts Chocolate and cocoa. Donuts. Peppermint and spearmint.   Fats and Oils High-fat foods, including French fries and potato chips. Other Vinegar. Strong spices, such as black pepper, white pepper, red pepper, cayenne, curry powder, cloves, ginger, and chili powder. The items listed above may not be a complete list of foods and beverages to avoid. Contact your dietitian for more information.   This information is not intended to replace advice given to you by your health care provider. Make sure you discuss any questions you have with your health care  provider.   Document Released: 07/01/2005 Document Revised: 07/22/2014 Document Reviewed: 05/05/2013 Elsevier Interactive Patient Education 2016 [employer] Inc.  

## 2016-03-28 NOTE — Progress Notes (Signed)
Subjective:    Patient ID: Steve Conner, male    DOB: 02-07-1971, 45 y.o.   MRN: [id number]  Pt presents to the office today for chronic follow up.  Hypertension  This is a chronic problem. The current episode started more than 1 month ago. The problem has been resolved since onset. The problem is controlled. Pertinent negatives include no anxiety, headaches, palpitations, peripheral edema or shortness of breath. Risk factors for coronary artery disease include male gender, obesity, sedentary lifestyle and family history. Past treatments include ACE inhibitors and diuretics. There is no history of kidney disease, CAD/MI, CVA, heart failure or a thyroid problem. There is no history of sleep apnea.  Hyperlipidemia  This is a chronic problem. The current episode started more than 1 month ago. The problem is uncontrolled. Recent lipid tests were reviewed and are high. Exacerbating diseases include obesity. He has no history of diabetes. Pertinent negatives include no shortness of breath. Current antihyperlipidemic treatment includes statins. The current treatment provides mild improvement of lipids. Risk factors for coronary artery disease include dyslipidemia, hypertension, male sex, obesity and a sedentary lifestyle.  Thyroid Problem  Presents for follow-up visit. Patient reports no constipation, diarrhea, dry skin, fatigue, hoarse voice, leg swelling, palpitations or weight gain. Past treatments include levothyroxine. The treatment provided moderate relief. His past medical history is significant for hyperlipidemia. There is no history of diabetes or heart failure.  Gastroesophageal Reflux  He complains of choking and heartburn. He reports no belching, no coughing, no dysphagia or no hoarse voice. This is a chronic problem. The current episode started more than 1 year ago. The problem occurs rarely. The problem has been waxing and waning. The symptoms are aggravated by lying down. Pertinent negatives  include no fatigue. Risk factors include obesity. He has tried an antacid for the symptoms. The treatment provided moderate relief.  Metabolic Syndrome PT currently taking Crestor and Tricor. PT states he has eliminated  all fried foods. Pt's triglycerides were very elevated.    Review of Systems  Constitutional: Negative.  Negative for fatigue and weight gain.  HENT: Negative.  Negative for hoarse voice.   Respiratory: Positive for choking. Negative for cough and shortness of breath.   Cardiovascular: Negative.  Negative for palpitations.  Gastrointestinal: Positive for heartburn. Negative for constipation, diarrhea and dysphagia.  Endocrine: Negative.   Genitourinary: Negative.   Musculoskeletal: Negative.   Neurological: Negative.  Negative for headaches.  Hematological: Negative.   Psychiatric/Behavioral: Negative.   All other systems reviewed and are negative.      Objective:   Physical Exam  Constitutional: He is oriented to person, place, and time. He appears well-developed and well-nourished. No distress.  HENT:  Head: Normocephalic.  Right Ear: External ear normal.  Left Ear: External ear normal.  Nose: Nose normal.  Mouth/Throat: Oropharynx is clear and moist.  Eyes: Pupils are equal, round, and reactive to light. Right eye exhibits no discharge. Left eye exhibits no discharge.  Neck: Normal range of motion. Neck supple. No thyromegaly present.  Cardiovascular: Normal rate, regular rhythm, normal heart sounds and intact distal pulses.   No murmur heard. Pulmonary/Chest: Effort normal and breath sounds normal. No respiratory distress. He has no wheezes.  Abdominal: Soft. Bowel sounds are normal. He exhibits no distension. There is no tenderness.  Musculoskeletal: Normal range of motion. He exhibits no edema or tenderness.  Neurological: He is alert and oriented to person, place, and time. He has normal reflexes. No cranial nerve deficit.  Skin:  Skin is warm and dry. No  rash noted. No erythema.  Psychiatric: He has a normal mood and affect. His behavior is normal. Judgment and thought content normal.  Vitals reviewed.    BP 116/66 (BP Location: Right Arm, Patient Position: Sitting, Cuff Size: Large)   Pulse (!) 56   Temp 97.5 F (36.4 C) (Oral)   Ht 6' (1.829 m)   Wt 233 lb 3.2 oz (105.8 kg)   BMI 31.63 kg/m       Assessment & Plan:  1. Essential hypertension - CMP14+EGFR  2. Gastroesophageal reflux disease, esophagitis presence not specified -Diet discussed- Avoid fried, spicy, citrus foods, caffeine and alcohol -Do not eat 2-3 hours before bedtime -Encouraged small frequent meals -Avoid NSAID's - CMP14+EGFR - pantoprazole (PROTONIX) 20 MG tablet; Take 1 tablet (20 mg total) by mouth daily.  Dispense: 90 tablet; Refill: 1  3. Hypothyroidism, unspecified hypothyroidism type - CMP14+EGFR - Thyroid Panel With TSH  4. Hypertriglyceridemia - CMP14+EGFR - Lipid panel  5. Obesity (BMI 30.0-34.9) - CMP14+EGFR  6. Vitamin D deficiency - CMP14+EGFR - VITAMIN D 25 Hydroxy (Vit-D Deficiency, Fractures)  7. Metabolic syndrome  - SYV48+YOYO   Continue all meds Labs pending Health Maintenance reviewed Diet and exercise encouraged RTO 3 months  Evelina Dun, [name]

## 2016-03-29 ENCOUNTER — Ambulatory Visit: Payer: BLUE CROSS/BLUE SHIELD | Admitting: Family

## 2016-03-29 LAB — CMP14+EGFR
ALBUMIN: 4.4 g/dL (ref 3.5–5.5)
ALK PHOS: 59 IU/L (ref 39–117)
ALT: 25 IU/L (ref 0–44)
AST: 20 IU/L (ref 0–40)
Albumin/Globulin Ratio: 1.6 (ref 1.2–2.2)
BILIRUBIN TOTAL: 0.4 mg/dL (ref 0.0–1.2)
BUN / CREAT RATIO: 20 (ref 9–20)
BUN: 22 mg/dL (ref 6–24)
CHLORIDE: 100 mmol/L (ref 96–106)
CO2: 25 mmol/L (ref 18–29)
Calcium: 9.7 mg/dL (ref 8.7–10.2)
Creatinine, Ser: 1.11 mg/dL (ref 0.76–1.27)
GFR calc Af Amer: 94 mL/min/{1.73_m2} (ref >59)
GFR calc non Af Amer: 81 mL/min/{1.73_m2} (ref >59)
GLUCOSE: 100 mg/dL — AB (ref 65–99)
Globulin, Total: 2.8 g/dL (ref 1.5–4.5)
Potassium: 4 mmol/L (ref 3.5–5.2)
Sodium: 143 mmol/L (ref 134–144)
Total Protein: 7.2 g/dL (ref 6.0–8.5)

## 2016-03-29 LAB — VITAMIN D 25 HYDROXY (VIT D DEFICIENCY, FRACTURES): Vit D, 25-Hydroxy: 33.3 ng/mL (ref 30.0–100.0)

## 2016-03-29 LAB — LIPID PANEL
CHOL/HDL RATIO: 5.5 ratio — AB (ref 0.0–5.0)
Cholesterol, Total: 148 mg/dL (ref 100–199)
HDL: 27 mg/dL — AB (ref >39)
LDL CALC: 62 mg/dL (ref 0–99)
Triglycerides: 295 mg/dL — ABNORMAL HIGH (ref 0–149)
VLDL CHOLESTEROL CAL: 59 mg/dL — AB (ref 5–40)

## 2016-03-29 LAB — THYROID PANEL WITH TSH
Free Thyroxine Index: 1.5 (ref 1.2–4.9)
T3 Uptake Ratio: 25 % (ref 24–39)
T4 TOTAL: 6.1 ug/dL (ref 4.5–12.0)
TSH: 1.82 u[IU]/mL (ref 0.450–4.500)

## 2016-04-01 ENCOUNTER — Ambulatory Visit (INDEPENDENT_AMBULATORY_CARE_PROVIDER_SITE_OTHER): Payer: BLUE CROSS/BLUE SHIELD | Admitting: Pharmacist

## 2016-04-01 ENCOUNTER — Encounter: Payer: Self-pay | Admitting: Pharmacist

## 2016-04-01 VITALS — BP 118/70 | HR 78 | Ht 72.0 in | Wt 233.0 lb

## 2016-04-01 DIAGNOSIS — E781 Pure hyperglyceridemia: Secondary | ICD-10-CM

## 2016-04-01 DIAGNOSIS — E785 Hyperlipidemia, unspecified: Secondary | ICD-10-CM | POA: Diagnosis not present

## 2016-04-01 MED ORDER — FENOFIBRATE 145 MG PO TABS: 145.0000 mg | ORAL_TABLET | Freq: Every day | ORAL | 2 refills | Status: DC

## 2016-04-01 NOTE — Patient Instructions (Signed)
Can switch to over the counter Vitamin D - recommend 400 to 1000 IU - take 1 capsule a day or you can continue weekly vitamin D if this option is cheaper.

## 2016-04-01 NOTE — Progress Notes (Signed)
Subjective:    Steve Conner is a 45 y.o. male who presents for evaluation of dyslipidemia. The patient does not use medications that may worsen dyslipidemias (corticosteroids, progestins, anabolic steroids, diuretics, beta-blockers, amiodarone, cyclosporine, olanzapine). Exercise: rarely. Previous history of cardiac disease includes: None.  Cardiac Risk Factors Age > 45-male, > 55-male:  NO  Smoking:   NO  Sig. family hx of CHD*:  YES  +1  Hypertension:   YES  +1  Diabetes:   NO  HDL < 35:   YES  +1  HDL > 59:   NO  Total:  3   *Significant family history of CHD per NCEP = MI or sudden death at less than 1 year old in father or other 1st-degree male relative, or less than 43 year old in mother or  other 1st-degree male relative  The following portions of the patient's history were reviewed and updated as appropriate: allergies, current medications, past family history, past medical history, past social history, past surgical history and problem list.   Objective:      Vitals:   04/01/16 0831  BP: 118/70  Pulse: 78   Body mass index is 31.6 kg/m.    Lab Review Office Visit on 03/28/2016  Component Date Value  . Glucose 03/29/2016 100*  . BUN 03/29/2016 22   . Creatinine, Ser 03/29/2016 1.11   . GFR calc non Af Amer 03/29/2016 81   . GFR calc Af Amer 03/29/2016 94   . BUN/Creatinine Ratio 03/29/2016 20   . Sodium 03/29/2016 143   . Potassium 03/29/2016 4.0   . Chloride 03/29/2016 100   . CO2 03/29/2016 25   . Calcium 03/29/2016 9.7   . Total Protein 03/29/2016 7.2   . Albumin 03/29/2016 4.4   . Globulin, Total 03/29/2016 2.8   . Albumin/Globulin Ratio 03/29/2016 1.6   . Bilirubin Total 03/29/2016 0.4   . Alkaline Phosphatase 03/29/2016 59   . AST 03/29/2016 20   . ALT 03/29/2016 25   . Cholesterol, Total 03/29/2016 148   . Triglycerides 03/29/2016 295*  . HDL 03/29/2016 27*  . VLDL Cholesterol Cal 03/29/2016 59*  . LDL Calculated 03/29/2016 62   . Chol/HDL  Ratio 03/29/2016 5.5*  . TSH 03/29/2016 1.820   . T4, Total 03/29/2016 6.1   . T3 Uptake Ratio 03/29/2016 25   . Free Thyroxine Index 03/29/2016 1.5   . Vit D, 25-Hydroxy 03/29/2016 33.3   Appointment on 01/25/2016  Component Date Value  . Rest HR 01/27/2016 56   . Rest BP 01/27/2016 110/78   . Exercise duration (min) 01/27/2016 8   . Exercise duration (sec) 01/27/2016 34   . Estimated workload 01/27/2016 9.6   . Peak HR 01/27/2016 157   . Peak BP 01/27/2016 142/90   . MPHR 01/27/2016 178   . Percent HR 01/27/2016 88   . RPE 01/27/2016 6   Office Visit on 12/26/2015  Component Date Value  . Glucose 12/27/2015 94   . BUN 12/27/2015 18   . Creatinine, Ser 12/27/2015 0.96   . GFR calc non Af Amer 12/27/2015 97   . GFR calc Af Amer 12/27/2015 112   . BUN/Creatinine Ratio 12/27/2015 19   . Sodium 12/27/2015 141   . Potassium 12/27/2015 3.7   . Chloride 12/27/2015 98   . CO2 12/27/2015 24   . Calcium 12/27/2015 9.3   . Total Protein 12/27/2015 7.4   . Albumin 12/27/2015 4.4   . Globulin, Total 12/27/2015 3.0   .  Albumin/Globulin Ratio 12/27/2015 1.5   . Bilirubin Total 12/27/2015 0.4   . Alkaline Phosphatase 12/27/2015 106   . AST 12/27/2015 34   . ALT 12/27/2015 54*  . Cholesterol, Total 12/27/2015 150   . Triglycerides 12/27/2015 739*  . HDL 12/27/2015 16*  . VLDL Cholesterol Cal 12/27/2015 Comment   . LDL Calculated 12/27/2015 Comment   . Chol/HDL Ratio 12/27/2015 9.4*  . TSH 12/27/2015 2.700   . T4, Total 12/27/2015 5.8   . T3 Uptake Ratio 12/27/2015 24   . Free Thyroxine Index 12/27/2015 1.4   Office Visit on 10/24/2015  Component Date Value  . Glucose 10/25/2015 85   . BUN 10/25/2015 21   . Creatinine, Ser 10/25/2015 0.99   . GFR calc non Af Amer 10/25/2015 94   . GFR calc Af Amer 10/25/2015 108   . BUN/Creatinine Ratio 10/25/2015 21*  . Sodium 10/25/2015 142   . Potassium 10/25/2015 4.0   . Chloride 10/25/2015 99   . CO2 10/25/2015 25   . Calcium  10/25/2015 9.6   Office Visit on 10/10/2015  Component Date Value  . Total Iron Binding Capac* 10/11/2015 381   . UIBC 10/11/2015 331   . Iron 10/11/2015 50   . Iron Saturation 10/11/2015 13*  . Ferritin 10/11/2015 116   . Vitamin B-12 10/11/2015 712   . Folate 10/11/2015 12.2   . WBC 10/11/2015 7.9   . RBC 10/11/2015 4.73   . Hemoglobin 10/11/2015 13.0   . Hematocrit 10/11/2015 37.2*  . MCV 10/11/2015 79   . [facility] 10/11/2015 27.5   . MCHC 10/11/2015 34.9   . RDW 10/11/2015 15.1   . Platelets 10/11/2015 205   . Neutrophils 10/11/2015 63   . Lymphs 10/11/2015 27   . Monocytes 10/11/2015 7   . Eos 10/11/2015 3   . Basos 10/11/2015 0   . Neutrophils Absolute 10/11/2015 4.9   . Lymphocytes Absolute 10/11/2015 2.2   . Monocytes Absolute 10/11/2015 0.6   . EOS (ABSOLUTE) 10/11/2015 0.2   . Basophils Absolute 10/11/2015 0.0   . Immature Granulocytes 10/11/2015 0   . Immature Grans (Abs) 10/11/2015 0.0   . Retic Ct Pct 10/11/2015 1.9   . Glucose 10/11/2015 102*  . BUN 10/11/2015 18   . Creatinine, Ser 10/11/2015 0.92   . GFR calc non Af Amer 10/11/2015 102   . GFR calc Af Amer 10/11/2015 118   . BUN/Creatinine Ratio 10/11/2015 20   . Sodium 10/11/2015 142   . Potassium 10/11/2015 3.7   . Chloride 10/11/2015 100   . CO2 10/11/2015 22   . Calcium 10/11/2015 9.1   . Total Protein 10/11/2015 7.0   . Albumin 10/11/2015 4.3   . Globulin, Total 10/11/2015 2.7   . Albumin/Globulin Ratio 10/11/2015 1.6   . Bilirubin Total 10/11/2015 0.4   . Alkaline Phosphatase 10/11/2015 110   . AST 10/11/2015 20   . ALT 10/11/2015 36   . Cholesterol, Total 10/11/2015 225*  . Triglycerides 10/11/2015 969*  . HDL 10/11/2015 17*  . VLDL Cholesterol Cal 10/11/2015 Comment   . LDL Calculated 10/11/2015 Comment   . Chol/HDL Ratio 10/11/2015 13.2*  . TSH 10/11/2015 4.590*  . T4, Total 10/11/2015 5.3   . T3 Uptake Ratio 10/11/2015 25   . Free Thyroxine Index 10/11/2015 1.3   . Prostate Specific  Ag, Se* 10/11/2015 1.3   . PSA, Free 10/11/2015 0.21   . PSA, Free Pct 10/11/2015 16.2   . Vit D, 25-Hydroxy 10/11/2015  19.9*  . HIV Screen 4th Generatio* 10/11/2015 Non Reactive       Assessment:    Dyslipidemia as detailed above with 3 CHD risk factors using NCEP scheme above.  Target levels for LDL are: < 100 mg/dl (CHD or "CHD risk equivalent" is present)  LDL is goal target but Tg still slightly elevated  ASCVD risk = 7% with no treatment  ASCVD risk with treatment = 3%  Explained to the patient the respective contributions of genetics, diet, and exercise to lipid levels and the use of medication in severe cases which do not respond to lifestyle alteration. The patient's interest and motivation in making lifestyle changes seems good.     Plan:    The following changes are planned for the next 3 months, at which time the patient will return for repeat fasting lipids:  1. Dietary changes: discussed limiting fat and high sugar / CHO foods dueto continued elevated triglycerides.   2. Exercise changes:  Advised to engage in walking briskly,  frequency goal is 3-4 times a week 3. Other treatment Treatment of hypertension (continue lisinopril HCT) and continue treatment for hypothyroidism  4. Lipid-lowering medications: continue fenofibrate 145mg  qd and continue rosuvastatin 20mg  qd  Note: The majority of the visit was spent in counseling on the pathophysiology and treatment of dyslipidemias. The total face-to-face time was in excess of 30 minutes.    Patient ID: Pamalee LeydenKenny Favorite, male   DOB: 03-27-1971, 45 y.o.   MRN: [id number]

## 2016-04-10 ENCOUNTER — Other Ambulatory Visit: Payer: Self-pay | Admitting: Family

## 2016-04-16 ENCOUNTER — Telehealth: Payer: Self-pay | Admitting: Family

## 2016-04-16 NOTE — Telephone Encounter (Signed)
Please advise 

## 2016-04-17 MED ORDER — HYDROCHLOROTHIAZIDE 25 MG PO TABS: 25.0000 mg | ORAL_TABLET | Freq: Every day | ORAL | 3 refills | Status: DC

## 2016-04-17 NOTE — Telephone Encounter (Signed)
Patient aware, made apt for bp check 04/29/16 with [facility].

## 2016-04-17 NOTE — Telephone Encounter (Signed)
I sent in a new rx of just HCTZ 24 mg without lisinopril. Lisinopril added to allergy list. Pt will need to follow up in two to recheck BP.

## 2016-04-24 ENCOUNTER — Encounter: Payer: Self-pay | Admitting: Physician Assistant

## 2016-04-24 ENCOUNTER — Ambulatory Visit (INDEPENDENT_AMBULATORY_CARE_PROVIDER_SITE_OTHER): Payer: BLUE CROSS/BLUE SHIELD | Admitting: Physician Assistant

## 2016-04-24 VITALS — BP 113/69 | HR 58 | Temp 97.4°F | Ht 72.0 in | Wt 232.8 lb

## 2016-04-24 DIAGNOSIS — T783XXS Angioneurotic edema, sequela: Secondary | ICD-10-CM

## 2016-04-24 DIAGNOSIS — L509 Urticaria, unspecified: Secondary | ICD-10-CM | POA: Diagnosis not present

## 2016-04-24 MED ORDER — LORATADINE 10 MG PO TABS: 10.0000 mg | ORAL_TABLET | Freq: Every day | ORAL | 11 refills | Status: DC

## 2016-04-24 NOTE — Patient Instructions (Signed)
Angioedema Angioedema Angioedema is a sudden swelling of tissues, often of the skin. It can occur on the face or genitals or in the abdomen or other body parts. The swelling usually develops over a short period and gets better in 24 to 48 hours. It often begins during the night and is found when the person wakes up. The person may also get red, itchy patches of skin (hives). Angioedema can be dangerous if it involves swelling of the air passages.  Depending on the cause, episodes of angioedema may only happen once, come back in unpredictable patterns, or repeat for several years and then gradually fade away.  CAUSES  Angioedema can be caused by an allergic reaction to various triggers. It can also result from nonallergic causes, including reactions to drugs, immune system disorders, viral infections, or an abnormal gene that is passed to you from your parents (hereditary). For some people with angioedema, the cause is unknown.  Some things that can trigger angioedema include:   Foods.   Medicines, such as ACE inhibitors, ARBs, nonsteroidal anti-inflammatory agents, or estrogen.   Latex.   Animal saliva.   Insect stings.   Dyes used in X-rays.   Mild injury.   Dental work.  Surgery.  Stress.   Sudden changes in temperature.   Exercise. SIGNS AND SYMPTOMS   Swelling of the skin.  Hives. If these are present, there is also intense itching.  Redness in the affected area.   Pain in the affected area.  Swollen lips or tongue.  Breathing problems. This may happen if the air passages swell.  Wheezing. If internal organs are involved, there may be:   Nausea.   Abdominal pain.   Vomiting.   Difficulty swallowing.   Difficulty passing urine. DIAGNOSIS   Your health care provider will examine the affected area and take a medical and family history.  Various tests may be done to help determine the cause. Tests may include:  Allergy skin tests to see if  the problem is an allergic reaction.   Blood tests to check for hereditary angioedema.   Tests to check for underlying diseases that could cause the condition.   A review of your medicines, including over-the-counter medicines, may be done. TREATMENT  Treatment will depend on the cause of the angioedema. Possible treatments include:   Removal of anything that triggered the condition (such as stopping certain medicines).   Medicines to treat symptoms or prevent attacks. Medicines given may include:   Antihistamines.   Epinephrine injection.   Steroids.   Hospitalization may be required for severe attacks. If the air passages are affected, it can be an emergency. Tubes may need to be placed to keep the airway open. HOME CARE INSTRUCTIONS   Take all medicines as directed by your health care provider.  If you were given medicines for emergency allergy treatment, always carry them with you.  Wear a medical bracelet as directed by your health care provider.   Avoid known triggers. SEEK MEDICAL CARE IF:   You have repeat attacks of angioedema.   Your attacks are more frequent or more severe despite preventive measures.   You have hereditary angioedema and are considering having children. It is important to discuss with your health care provider the risks of passing the condition on to your children. SEEK IMMEDIATE MEDICAL CARE IF:   You have severe swelling of the mouth, tongue, or lips.  You have difficulty breathing.   You have difficulty swallowing.   You faint.  MAKE SURE YOU:  Understand these instructions.  Will watch your condition.  Will get help right away if you are not doing well or get worse.   This information is not intended to replace advice given to you by your health care provider. Make sure you discuss any questions you have with your health care provider.   Document Released: 09/09/2001 Document Revised: 07/22/2014 Document Reviewed:  02/22/2013 Elsevier Interactive Patient Education [employer]2016 [employer]vier Inc.

## 2016-04-26 NOTE — Progress Notes (Signed)
 BP 113/69   Pulse (!) 58   Temp 97.4 F (36.3 C) (Oral)   Ht 6' (1.829 m)   Wt 232 lb 12.8 oz (105.6 kg)   BMI 31.57 kg/m    Subjective:    Patient ID: Steve Conner, male    DOB: 05/15/1971, 44 y.o.   MRN: [id number]  HPI: Steve Conner is a 44 y.o. male presenting on 04/24/2016 for Urticaria  About 2 weeks ago the patient was seen through urgent care and told that he had edema or a reaction to his ACE inhibitor. He didn't stop the lisinopril. He was given a steroid shot and told to take Benadryl. He has not continued with any antihistamine at this time. In the past day he has had a recurrence of the hives on his arms. At today's visit and they are slightly decreased from where they were earlier in the day. 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 I have encouraged him to get on a daily thing and generation antihistamine such as Zyrtec or Claritin. He may possibly need to take a histamine 2 blocker. He is encouraged to take this for the next 3 months and then he can try to stop it and if the urticaria comes back he may need allergy evaluation.  Relevant past medical, surgical, family and social history reviewed and updated as indicated. Allergies and medications reviewed and updated.  Past Medical History:  Diagnosis Date  . GERD (gastroesophageal reflux disease)   . Hyperlipidemia   . Thyroid disease     History reviewed. No pertinent surgical history.  Review of Systems  Constitutional: Negative.  Negative for appetite change and fatigue.  HENT: Negative.   Eyes: Negative.  Negative for pain and visual disturbance.  Respiratory: Negative.  Negative for cough, chest tightness, shortness of breath and wheezing.   Cardiovascular: Negative.  Negative for chest pain, palpitations and leg swelling.  Gastrointestinal: Negative.  Negative for abdominal pain, diarrhea, nausea and vomiting.  Endocrine: Negative.   Genitourinary: Negative.    Musculoskeletal: Negative.   Skin: Positive for color change and rash.  Neurological: Negative.  Negative for weakness, numbness and headaches.  Psychiatric/Behavioral: Negative.       Medication List       Accurate as of 04/24/16 11:59 PM. Always use your most recent med list.          fenofibrate 145 MG tablet Commonly known as:  TRICOR Take 1 tablet (145 mg total) by mouth daily.   hydrochlorothiazide 25 MG tablet Commonly known as:  HYDRODIURIL Take 1 tablet (25 mg total) by mouth daily.   levothyroxine 25 MCG tablet Commonly known as:  SYNTHROID, LEVOTHROID TAKE 1 TABLET (25 MCG TOTAL) BY MOUTH DAILY BEFORE BREAKFAST.   loratadine 10 MG tablet Commonly known as:  CLARITIN Take 1 tablet (10 mg total) by mouth daily.   pantoprazole 20 MG tablet Commonly known as:  PROTONIX Take 1 tablet (20 mg total) by mouth daily.   rosuvastatin 20 MG tablet Commonly known as:  CRESTOR Take 1 tablet (20 mg total) by mouth daily.   Vitamin D (Ergocalciferol) 50000 units Caps capsule Commonly known as:  DRISDOL Take 1 capsule (50,000 Units total) by mouth every 7 (seven) days.          Objective:    BP 113/69   Pulse (!) 58   Temp 97.4 F (36.3 C) (Oral)   Ht 6' (1.829 m)     Wt 232 lb 12.8 oz (105.6 kg)   BMI 31.57 kg/m   Allergies  Allergen Reactions  . Lisinopril Swelling  . Penicillins     Physical Exam  Constitutional: He appears well-developed and well-nourished. No distress.  HENT:  Head: Normocephalic and atraumatic.  Eyes: Conjunctivae and EOM are normal. Pupils are equal, round, and reactive to light.  Cardiovascular: Normal rate, regular rhythm and normal heart sounds.   Pulmonary/Chest: Effort normal and breath sounds normal. No respiratory distress.  Skin: Skin is warm and dry. Rash noted. Rash is macular and urticarial. There is erythema.  Psychiatric: He has a normal mood and affect. His behavior is normal.  Nursing note and vitals  reviewed.   Results for orders placed or performed in visit on 03/28/16  CMP14+EGFR  Result Value Ref Range   Glucose 100 (H) 65 - 99 mg/dL   BUN 22 6 - 24 mg/dL   Creatinine, Ser 1.11 0.76 - 1.27 mg/dL   GFR calc non Af Amer 81 >59 mL/min/1.73   GFR calc Af Amer 94 >59 mL/min/1.73   BUN/Creatinine Ratio 20 9 - 20   Sodium 143 134 - 144 mmol/L   Potassium 4.0 3.5 - 5.2 mmol/L   Chloride 100 96 - 106 mmol/L   CO2 25 18 - 29 mmol/L   Calcium 9.7 8.7 - 10.2 mg/dL   Total Protein 7.2 6.0 - 8.5 g/dL   Albumin 4.4 3.5 - 5.5 g/dL   Globulin, Total 2.8 1.5 - 4.5 g/dL   Albumin/Globulin Ratio 1.6 1.2 - 2.2   Bilirubin Total 0.4 0.0 - 1.2 mg/dL   Alkaline Phosphatase 59 39 - 117 IU/L   AST 20 0 - 40 IU/L   ALT 25 0 - 44 IU/L  Lipid panel  Result Value Ref Range   Cholesterol, Total 148 100 - 199 mg/dL   Triglycerides 295 (H) 0 - 149 mg/dL   HDL 27 (L) >39 mg/dL   VLDL Cholesterol Cal 59 (H) 5 - 40 mg/dL   LDL Calculated 62 0 - 99 mg/dL   Chol/HDL Ratio 5.5 (H) 0.0 - 5.0 ratio units  Thyroid Panel With TSH  Result Value Ref Range   TSH 1.820 0.450 - 4.500 uIU/mL   T4, Total 6.1 4.5 - 12.0 ug/dL   T3 Uptake Ratio 25 24 - 39 %   Free Thyroxine Index 1.5 1.2 - 4.9  VITAMIN D 25 Hydroxy (Vit-D Deficiency, Fractures)  Result Value Ref Range   Vit D, 25-Hydroxy 33.3 30.0 - 100.0 ng/mL      Assessment & Plan:   1. Angioedema, sequela - loratadine (CLARITIN) 10 MG tablet; Take 1 tablet (10 mg total) by mouth daily.  Dispense: 30 tablet; Refill: 11  2. Hives - loratadine (CLARITIN) 10 MG tablet; Take 1 tablet (10 mg total) by mouth daily.  Dispense: 30 tablet; Refill: 11   Continue all other maintenance medications as listed above.  Follow up plan: Return for keep follow up.  Educational handout given for Angioedema   S.  PA-C [facility] [address]  [place], [place] [address] [phone]   04/26/2016, 1:48 PM   

## 2016-04-29 ENCOUNTER — Encounter: Payer: Self-pay | Admitting: Family

## 2016-04-29 ENCOUNTER — Ambulatory Visit (INDEPENDENT_AMBULATORY_CARE_PROVIDER_SITE_OTHER): Payer: BLUE CROSS/BLUE SHIELD | Admitting: Family

## 2016-04-29 VITALS — BP 133/86 | HR 64 | Temp 97.0°F | Resp 18 | Ht 72.0 in | Wt 235.4 lb

## 2016-04-29 DIAGNOSIS — H6123 Impacted cerumen, bilateral: Secondary | ICD-10-CM | POA: Diagnosis not present

## 2016-04-29 DIAGNOSIS — Z888 Allergy status to other drugs, medicaments and biological substances status: Secondary | ICD-10-CM | POA: Diagnosis not present

## 2016-04-29 DIAGNOSIS — I10 Essential (primary) hypertension: Secondary | ICD-10-CM

## 2016-04-29 NOTE — Progress Notes (Signed)
   Subjective:    Patient ID: Steve Conner, male    DOB: 12/11/1970, 45 y.o.   MRN: [id number]  Pt presents to the office today to recheck BP. Pt was seen on 04/24/16 with angioedema and hives and had previously been seen in the Urgent Care two weeks prior. PT was told this was related to his ACE. PT stopped his lisinopril and was placed on HCTZ 25 mg daily with no complaints. PT states since his last visit he states he has not had anymore urticaria or angioedema.  Hypertension  This is a chronic problem. The current episode started more than 1 year ago. The problem has been resolved since onset. The problem is controlled. Pertinent negatives include no anxiety, blurred vision, headaches, palpitations, peripheral edema or shortness of breath. Risk factors for coronary artery disease include dyslipidemia, male gender, obesity and family history. Past treatments include diuretics. The current treatment provides significant improvement. There is no history of kidney disease, CAD/MI, CVA, heart failure or a thyroid problem.      Review of Systems  Eyes: Negative for blurred vision.  Respiratory: Negative for shortness of breath.   Cardiovascular: Negative for palpitations.  Neurological: Negative for headaches.  All other systems reviewed and are negative.      Objective:   Physical Exam  Constitutional: He is oriented to person, place, and time. He appears well-developed and well-nourished. No distress.  HENT:  Head: Normocephalic.  Mouth/Throat: Oropharynx is clear and moist.  Bilateral ears impacted  Eyes: Pupils are equal, round, and reactive to light. Right eye exhibits no discharge. Left eye exhibits no discharge.  Neck: Normal range of motion. Neck supple. No thyromegaly present.  Cardiovascular: Normal rate, regular rhythm, normal heart sounds and intact distal pulses.   No murmur heard. Pulmonary/Chest: Effort normal and breath sounds normal. No respiratory distress. He has no  wheezes.  Abdominal: Soft. Bowel sounds are normal. He exhibits no distension. There is no tenderness.  Musculoskeletal: Normal range of motion. He exhibits no tenderness.  Neurological: He is alert and oriented to person, place, and time.  Skin: Skin is warm and dry. No rash noted. No erythema.  Psychiatric: He has a normal mood and affect. His behavior is normal. Judgment and thought content normal.  Vitals reviewed.   BP 133/86   Pulse 64   Temp 97 F (36.1 C) (Oral)   Resp 18   Ht 6' (1.829 m)   Wt 235 lb 6.4 oz (106.8 kg)   BMI 31.93 kg/m   Bilateral ears cleaned with warm perioxide. TM WNL     Assessment & Plan:  1. Essential hypertension --Daily blood pressure log given with instructions on how to fill out and told to bring to next visit -Dash diet information given -Exercise encouraged - Stress Management  -Continue current meds -RTO in 6 months  2. Allergy to lisinopril -Lisinopril on allergy list2  3. Bilateral impacted cerumen -OTC wax drops  Steve Rodneyhristy Eyanna Mcgonagle, [name]

## 2016-04-29 NOTE — Patient Instructions (Signed)
Cerumen Impaction The structures of the external ear canal secrete a waxy substance known as cerumen. Excess cerumen can build up in the ear canal, causing a condition known as cerumen impaction. Cerumen impaction can cause ear pain and disrupt the function of the ear. The rate of cerumen production differs for each individual. In certain individuals, the configuration of the ear canal may decrease his or her ability to naturally remove cerumen. CAUSES Cerumen impaction is caused by excessive cerumen production or buildup. RISK FACTORS  Frequent use of swabs to clean ears.  Having narrow ear canals.  Having eczema.  Being dehydrated. SIGNS AND SYMPTOMS  Diminished hearing.  Ear drainage.  Ear pain.  Ear itch. TREATMENT Treatment may involve:  Over-the-counter or prescription ear drops to soften the cerumen.  Removal of cerumen by a health care provider. This may be done with:  Irrigation with warm water. This is the most common method of removal.  Ear curettes and other instruments.  Surgery. This may be done in severe cases. HOME CARE INSTRUCTIONS  Take medicines only as directed by your health care provider.  Do not insert objects into the ear with the intent of cleaning the ear. PREVENTION  Do not insert objects into the ear, even with the intent of cleaning the ear. Removing cerumen as a part of normal hygiene is not necessary, and the use of swabs in the ear canal is not recommended.  Drink enough water to keep your urine clear or pale yellow.  Control your eczema if you have it. SEEK MEDICAL CARE IF:  You develop ear pain.  You develop bleeding from the ear.  The cerumen does not clear after you use ear drops as directed.   This information is not intended to replace advice given to you by your health care provider. Make sure you discuss any questions you have with your health care provider.   Document Released: 08/08/2004 Document Revised: 07/22/2014  Document Reviewed: 02/15/2015 Elsevier Interactive Patient Education 2016 Elsevier Inc.  

## 2016-07-01 ENCOUNTER — Ambulatory Visit (INDEPENDENT_AMBULATORY_CARE_PROVIDER_SITE_OTHER): Payer: BLUE CROSS/BLUE SHIELD | Admitting: Family

## 2016-07-01 ENCOUNTER — Encounter: Payer: Self-pay | Admitting: Family

## 2016-07-01 VITALS — BP 128/77 | HR 59 | Temp 97.5°F | Ht 72.0 in | Wt 249.4 lb

## 2016-07-01 DIAGNOSIS — E8881 Metabolic syndrome: Secondary | ICD-10-CM | POA: Diagnosis not present

## 2016-07-01 DIAGNOSIS — E669 Obesity, unspecified: Secondary | ICD-10-CM | POA: Diagnosis not present

## 2016-07-01 DIAGNOSIS — K219 Gastro-esophageal reflux disease without esophagitis: Secondary | ICD-10-CM

## 2016-07-01 DIAGNOSIS — I10 Essential (primary) hypertension: Secondary | ICD-10-CM

## 2016-07-01 DIAGNOSIS — E559 Vitamin D deficiency, unspecified: Secondary | ICD-10-CM | POA: Diagnosis not present

## 2016-07-01 DIAGNOSIS — E039 Hypothyroidism, unspecified: Secondary | ICD-10-CM | POA: Diagnosis not present

## 2016-07-01 DIAGNOSIS — E781 Pure hyperglyceridemia: Secondary | ICD-10-CM | POA: Diagnosis not present

## 2016-07-01 DIAGNOSIS — E66811 Obesity, class 1: Secondary | ICD-10-CM

## 2016-07-01 MED ORDER — ROSUVASTATIN CALCIUM 10 MG PO TABS: 10.0000 mg | ORAL_TABLET | Freq: Every day | ORAL | 5 refills | Status: DC

## 2016-07-01 NOTE — Progress Notes (Signed)
Subjective:    Patient ID: Steve Conner, male    DOB: [birth year], 45 y.o.   MRN: [id number]  Pt presents to the office today for chronic follow up.  Hypertension  This is a chronic problem. The current episode started more than 1 month ago. The problem has been resolved since onset. The problem is controlled. Associated symptoms include shortness of breath (At times). Pertinent negatives include no anxiety, headaches, palpitations or peripheral edema. Risk factors for coronary artery disease include male gender, obesity, sedentary lifestyle and family history. Past treatments include diuretics. There is no history of kidney disease, CAD/MI, CVA, heart failure or a thyroid problem. There is no history of sleep apnea.  Hyperlipidemia  This is a chronic problem. The current episode started more than 1 year ago. The problem is uncontrolled. Recent lipid tests were reviewed and are high. Exacerbating diseases include obesity. He has no history of diabetes. Associated symptoms include shortness of breath (At times). Current antihyperlipidemic treatment includes statins. The current treatment provides mild improvement of lipids. Risk factors for coronary artery disease include dyslipidemia, hypertension, male sex, obesity and a sedentary lifestyle.  Thyroid Problem  Presents for follow-up visit. Symptoms include fatigue. Patient reports no constipation, diarrhea, dry skin, hoarse voice, leg swelling, palpitations or weight gain. Past treatments include levothyroxine. The treatment provided moderate relief. His past medical history is significant for hyperlipidemia. There is no history of diabetes or heart failure.  Gastroesophageal Reflux  He complains of choking and heartburn. He reports no belching, no coughing, no dysphagia or no hoarse voice. This is a chronic problem. The current episode started more than 1 year ago. The problem occurs rarely. The problem has been waxing and waning. The symptoms are  aggravated by lying down. Associated symptoms include fatigue. Risk factors include obesity. He has tried an antacid for the symptoms. The treatment provided moderate relief.  Metabolic Syndrome PT currently taking Crestor and Tricor. PT states he tired to eliminated  all fried foods.    Review of Systems  Constitutional: Positive for fatigue. Negative for weight gain.  HENT: Negative.  Negative for hoarse voice.   Respiratory: Positive for choking and shortness of breath (At times). Negative for cough.   Cardiovascular: Negative.  Negative for palpitations.  Gastrointestinal: Positive for heartburn. Negative for constipation, diarrhea and dysphagia.  Endocrine: Negative.   Genitourinary: Negative.   Musculoskeletal: Negative.   Neurological: Negative.  Negative for headaches.  Hematological: Negative.   Psychiatric/Behavioral: Negative.   All other systems reviewed and are negative.      Objective:   Physical Exam  Constitutional: He is oriented to person, place, and time. He appears well-developed and well-nourished. No distress.  HENT:  Head: Normocephalic.  Right Ear: External ear normal.  Left Ear: External ear normal.  Nose: Nose normal.  Mouth/Throat: Oropharynx is clear and moist.  Eyes: Pupils are equal, round, and reactive to light. Right eye exhibits no discharge. Left eye exhibits no discharge.  Neck: Normal range of motion. Neck supple. No thyromegaly present.  Cardiovascular: Normal rate, regular rhythm, normal heart sounds and intact distal pulses.   No murmur heard. Pulmonary/Chest: Effort normal and breath sounds normal. No respiratory distress. He has no wheezes.  Abdominal: Soft. Bowel sounds are normal. He exhibits no distension. There is no tenderness.  Musculoskeletal: Normal range of motion. He exhibits no edema or tenderness.  Neurological: He is alert and oriented to person, place, and time. He has normal reflexes. No cranial nerve deficit.  Skin: Skin  is warm and dry. No rash noted. No erythema.  Psychiatric: He has a normal mood and affect. His behavior is normal. Judgment and thought content normal.  Vitals reviewed.    BP 128/77   Pulse (!) 59   Temp 97.5 F (36.4 C) (Oral)   Ht 6' (1.829 m)   Wt 249 lb 6.4 oz (113.1 kg)   BMI 33.82 kg/m       Assessment & Plan:  1. Essential hypertension - CMP14+EGFR  2. Gastroesophageal reflux disease, esophagitis presence not specified - CMP14+EGFR  3. Hypothyroidism, unspecified type - CMP14+EGFR - Thyroid Panel With TSH  4. Hypertriglyceridemia -Decreased Crestor from 20 mg to 10 mg - CMP14+EGFR - Lipid panel  5. Metabolic syndrome - UVH06+UJNW - Lipid panel  6. Obesity (BMI 30.0-34.9) - CMP14+EGFR  7. Vitamin D deficiency - CMP14+EGFR - VITAMIN D 25 Hydroxy (Vit-D Deficiency, Fractures)   Continue all meds Labs pending Health Maintenance reviewed Diet and exercise encouraged RTO 6 months  Evelina Dun, [name]

## 2016-07-01 NOTE — Patient Instructions (Signed)
Fat and Cholesterol Restricted Diet Introduction Getting too much fat and cholesterol in your diet may cause health problems. Following this diet helps keep your fat and cholesterol at normal levels. This can keep you from getting sick. What types of fat should I choose?  Choose monosaturated and polyunsaturated fats. These are found in foods such as olive oil, canola oil, flaxseeds, walnuts, almonds, and seeds.  Eat more omega-3 fats. Good choices include salmon, mackerel, sardines, tuna, flaxseed oil, and ground flaxseeds.  Limit saturated fats. These are in animal products such as meats, butter, and cream. They can also be in plant products such as palm oil, palm kernel oil, and coconut oil.  Avoid foods with partially hydrogenated oils in them. These contain trans fats. Examples of foods that have trans fats are stick margarine, some tub margarines, cookies, crackers, and other baked goods. What general guidelines do I need to follow?  Check food labels. Look for the words "trans fat" and "saturated fat."  When preparing a meal:  Fill half of your plate with vegetables and green salads.  Fill one fourth of your plate with whole grains. Look for the word "whole" as the first word in the ingredient list.  Fill one fourth of your plate with lean protein foods.  Eat more foods that have fiber, like apples, carrots, beans, peas, and barley.  Eat more home-cooked foods. Eat less at restaurants and buffets.  Limit or avoid alcohol.  Limit foods high in starch and sugar.  Limit fried foods.  Cook foods without frying them. Baking, boiling, grilling, and broiling are all great options.  Lose weight if you are overweight. Losing even a small amount of weight can help your overall health. It can also help prevent diseases such as diabetes and heart disease. What foods can I eat? Grains  Whole grains, such as whole wheat or whole grain breads, crackers, cereals, and pasta. Unsweetened  oatmeal, bulgur, barley, quinoa, or brown rice. Corn or whole wheat flour tortillas. Vegetables  Fresh or frozen vegetables (raw, steamed, roasted, or grilled). Green salads. Fruits  All fresh, canned (in natural juice), or frozen fruits. Meat and Other Protein Products  Ground beef (85% or leaner), grass-fed beef, or beef trimmed of fat. Skinless chicken or turkey. Ground chicken or turkey. Pork trimmed of fat. All fish and seafood. Eggs. Dried beans, peas, or lentils. Unsalted nuts or seeds. Unsalted canned or dry beans. Dairy  Low-fat dairy products, such as skim or 1% milk, 2% or reduced-fat cheeses, low-fat ricotta or cottage cheese, or plain low-fat yogurt. Fats and Oils  Tub margarines without trans fats. Light or reduced-fat mayonnaise and salad dressings. Avocado. Olive, canola, sesame, or safflower oils. Natural peanut or almond butter (choose ones without added sugar and oil). The items listed above may not be a complete list of recommended foods or beverages. Contact your dietitian for more options.  What foods are not recommended? Grains  White bread. White pasta. White rice. Cornbread. Bagels, pastries, and croissants. Crackers that contain trans fat. Vegetables  White potatoes. Corn. Creamed or fried vegetables. Vegetables in a cheese sauce. Fruits  Dried fruits. Canned fruit in light or heavy syrup. Fruit juice. Meat and Other Protein Products  Fatty cuts of meat. Ribs, chicken wings, bacon, sausage, bologna, salami, chitterlings, fatback, hot dogs, bratwurst, and packaged luncheon meats. Liver and organ meats. Dairy  Whole or 2% milk, cream, half-and-half, and cream cheese. Whole milk cheeses. Whole-fat or sweetened yogurt. Full-fat cheeses. Nondairy creamers and   whipped toppings. Processed cheese, cheese spreads, or cheese curds. Sweets and Desserts  Corn syrup, sugars, honey, and molasses. Candy. Jam and jelly. Syrup. Sweetened cereals. Cookies, pies, cakes, donuts,  muffins, and ice cream. Fats and Oils  Butter, stick margarine, lard, shortening, ghee, or bacon fat. Coconut, palm kernel, or palm oils. Beverages  Alcohol. Sweetened drinks (such as sodas, lemonade, and fruit drinks or punches). The items listed above may not be a complete list of foods and beverages to avoid. Contact your dietitian for more information.  This information is not intended to replace advice given to you by your health care provider. Make sure you discuss any questions you have with your health care provider. Document Released: 12/31/2011 Document Revised: 03/07/2016 Document Reviewed: 09/30/2013  2017 Elsevier  

## 2016-07-02 LAB — CMP14+EGFR
ALK PHOS: 75 IU/L (ref 39–117)
ALT: 42 IU/L (ref 0–44)
AST: 25 IU/L (ref 0–40)
Albumin/Globulin Ratio: 1.4 (ref 1.2–2.2)
Albumin: 4.1 g/dL (ref 3.5–5.5)
BILIRUBIN TOTAL: 0.5 mg/dL (ref 0.0–1.2)
BUN/Creatinine Ratio: 16 (ref 9–20)
BUN: 17 mg/dL (ref 6–24)
CHLORIDE: 100 mmol/L (ref 96–106)
CO2: 24 mmol/L (ref 18–29)
CREATININE: 1.04 mg/dL (ref 0.76–1.27)
Calcium: 9.2 mg/dL (ref 8.7–10.2)
GFR calc Af Amer: 101 mL/min/{1.73_m2} (ref >59)
GFR calc non Af Amer: 88 mL/min/{1.73_m2} (ref >59)
GLUCOSE: 87 mg/dL (ref 65–99)
Globulin, Total: 2.9 g/dL (ref 1.5–4.5)
Potassium: 3.7 mmol/L (ref 3.5–5.2)
Sodium: 140 mmol/L (ref 134–144)
Total Protein: 7 g/dL (ref 6.0–8.5)

## 2016-07-02 LAB — LIPID PANEL
CHOLESTEROL TOTAL: 171 mg/dL (ref 100–199)
Chol/HDL Ratio: 6.3 ratio units — ABNORMAL HIGH (ref 0.0–5.0)
HDL: 27 mg/dL — AB (ref >39)
LDL CALC: 67 mg/dL (ref 0–99)
TRIGLYCERIDES: 387 mg/dL — AB (ref 0–149)
VLDL CHOLESTEROL CAL: 77 mg/dL — AB (ref 5–40)

## 2016-07-02 LAB — THYROID PANEL WITH TSH
FREE THYROXINE INDEX: 1.2 (ref 1.2–4.9)
T3 Uptake Ratio: 24 % (ref 24–39)
T4, Total: 5 ug/dL (ref 4.5–12.0)
TSH: 3.36 u[IU]/mL (ref 0.450–4.500)

## 2016-07-02 LAB — VITAMIN D 25 HYDROXY (VIT D DEFICIENCY, FRACTURES): Vit D, 25-Hydroxy: 24.7 ng/mL — ABNORMAL LOW (ref 30.0–100.0)

## 2016-08-27 ENCOUNTER — Ambulatory Visit (INDEPENDENT_AMBULATORY_CARE_PROVIDER_SITE_OTHER): Payer: BLUE CROSS/BLUE SHIELD | Admitting: Physician Assistant

## 2016-08-27 ENCOUNTER — Encounter: Payer: Self-pay | Admitting: Physician Assistant

## 2016-08-27 VITALS — BP 128/74 | HR 62 | Temp 97.1°F | Ht 72.0 in | Wt 249.8 lb

## 2016-08-27 DIAGNOSIS — I10 Essential (primary) hypertension: Secondary | ICD-10-CM

## 2016-08-27 DIAGNOSIS — R200 Anesthesia of skin: Secondary | ICD-10-CM | POA: Diagnosis not present

## 2016-08-27 DIAGNOSIS — H9313 Tinnitus, bilateral: Secondary | ICD-10-CM | POA: Diagnosis not present

## 2016-08-27 MED ORDER — HYDROCHLOROTHIAZIDE 25 MG PO TABS: 50.0000 mg | ORAL_TABLET | Freq: Every day | ORAL | 3 refills | Status: DC

## 2016-08-27 NOTE — Patient Instructions (Signed)
Meniere Disease Meniere disease is an inner ear disorder. It causes attacks of a spinning sensation (vertigo) and ringing in the ear (tinnitus). It also causes hearing loss and a sensation of fullness or pressure in your ear.  Meniere disease is lifelong. It may get worse over time. Symptoms usually begin in one ear but may eventually affect both ears.  CAUSES Meniere disease is caused by having too much of the fluid that is in your inner ear (endolymph). When endolymph builds up in your inner ear, it affects the nerves that control balance and hearing. The reason for the endolymph buildup is not known. Possible causes include:  Allergy.  An abnormal reaction of the body's defense system (autoimmune disease).  Viral infection of the inner ear.  Head injury. RISK FACTORS  Age older than 40 years.  Family history of Meniere disease.  History of autoimmune disease.  History of migraine headaches. SIGNS AND SYMPTOMS Symptoms of Meniere disease can come and go and may last for up to 4 hours at a time. Symptoms usually start in one ear and may become more frequent and eventually involve both ears. Symptoms can include:  Fullness and pressure in your ear.  Roaring or ringing in your ear.  Vertigo and loss of balance.  Decreased hearing.  Nausea and vomiting. DIAGNOSIS Your health care provider will perform a physical exam. Tests may be done to confirm a diagnosis of Meniere disease. These tests may include:  A hearing test (audiogram).  An electronystagmogram. This tests your balance nerve (vestibular nerve).  Imaging studies, such as CT or MRI, of your inner ear. TREATMENT There is no cure for Meniere disease, but it can be managed. Management may include:  A diet that may help relieve symptoms of Meniere disease.  Use of medicines to reduce:  Vertigo.  Nausea.  Fluid retention.  Use of an air pressure pulse generator. This is a machine that sends small pressure  pulses into your ear canal.  Inner ear surgery (rare). When you experience symptoms, it can be helpful to lie down on a flat surface and focus your eyes on one object that does not move. Try to stay in that position until your symptoms go away.  HOME CARE INSTRUCTIONS   Take medicines only as directed by your health care provider.  Eat the same amount of food at the same time every day, including snacks.  Do not skip meals.  Limit the salt in your diet to 1,000 mg a day.  Avoid caffeine.  Limit alcoholic drinks to one drink a day.  Do not eat foods containing monosodium glutamate (MSG).  Drink enough fluids to keep your urine clear or pale yellow.  Do not use any tobacco products including cigarettes, chewing tobacco, or electronic cigarettes. If you need help quitting, ask your health care provider.  Find ways to reduce or avoid stress. SEEK MEDICAL CARE IF:   You have symptoms that last longer than 4 hours.  You have new or more severe symptoms. SEEK IMMEDIATE MEDICAL CARE IF:   You have been vomiting for 24 hours.  You are not able to keep fluids down.  You have chest pain or trouble breathing. This information is not intended to replace advice given to you by your health care provider. Make sure you discuss any questions you have with your health care provider. Document Released: 06/28/2000 Document Revised: 07/22/2014 Document Reviewed: 06/14/2013 Elsevier Interactive Patient Education  2017 Elsevier Inc.  

## 2016-08-29 NOTE — Progress Notes (Signed)
BP 128/74   Pulse 62   Temp 97.1 F (36.2 C) (Oral)   Ht 6' (1.829 m)   Wt 249 lb 12.8 oz (113.3 kg)   BMI 33.88 kg/m    Subjective:    Patient ID: Steve Conner, male    DOB: October 08, 1970, 46 y.o.   MRN: [id number]  HPI: Steve Conner is a 46 y.o. male presenting on 08/27/2016 for Hypertension (right hand numbness ) and Tinnitus  This patient comes in for periodic recheck on medications and conditions. All medications are reviewed today. There are no reports of any problems with the medications. All of the medical conditions are reviewed and updated.  Lab work is reviewed and will be ordered as medically necessary. BP was up at Er, but good readings at home. Normal today. Right ulnar surface with numbness, no loss of strength. Also reports ongoing ringing in the ears. He states it is all day long. It is worse when he lays on that night. This is gone on for many months. He does have an occasional episode of dizziness mostly associated with movement.  Relevant past medical, surgical, family and social history reviewed and updated as indicated. Allergies and medications reviewed and updated.  Past Medical History:  Diagnosis Date  . GERD (gastroesophageal reflux disease)   . Hyperlipidemia   . Thyroid disease     History reviewed. No pertinent surgical history.  Review of Systems  Constitutional: Negative.  Negative for appetite change and fatigue.  Eyes: Negative for pain and visual disturbance.  Respiratory: Negative.  Negative for cough, chest tightness, shortness of breath and wheezing.   Cardiovascular: Negative.  Negative for chest pain, palpitations and leg swelling.  Gastrointestinal: Negative.  Negative for abdominal pain, diarrhea, nausea and vomiting.  Genitourinary: Negative.   Skin: Negative.  Negative for color change and rash.  Neurological: Positive for dizziness, light-headedness and numbness. Negative for tremors, seizures, syncope, facial asymmetry, speech  difficulty, weakness and headaches.  Psychiatric/Behavioral: Negative.     Allergies as of 08/27/2016      Reactions   Lisinopril Swelling   Penicillins       Medication List       Accurate as of 08/27/16 11:59 PM. Always use your most recent med list.          fenofibrate 145 MG tablet Commonly known as:  TRICOR Take 1 tablet (145 mg total) by mouth daily.   hydrochlorothiazide 25 MG tablet Commonly known as:  HYDRODIURIL Take 2 tablets (50 mg total) by mouth daily.   levothyroxine 25 MCG tablet Commonly known as:  SYNTHROID, LEVOTHROID TAKE 1 TABLET (25 MCG TOTAL) BY MOUTH DAILY BEFORE BREAKFAST.   loratadine 10 MG tablet Commonly known as:  CLARITIN Take 1 tablet (10 mg total) by mouth daily.   pantoprazole 20 MG tablet Commonly known as:  PROTONIX Take 1 tablet (20 mg total) by mouth daily.   rosuvastatin 10 MG tablet Commonly known as:  CRESTOR Take 1 tablet (10 mg total) by mouth daily.   Vitamin D (Ergocalciferol) 50000 units Caps capsule Commonly known as:  DRISDOL Take 1 capsule (50,000 Units total) by mouth every 7 (seven) days.          Objective:    BP 128/74   Pulse 62   Temp 97.1 F (36.2 C) (Oral)   Ht 6' (1.829 m)   Wt 249 lb 12.8 oz (113.3 kg)   BMI 33.88 kg/m   Allergies  Allergen Reactions  . Lisinopril  Swelling  . Penicillins     Physical Exam  Constitutional: He appears well-developed and well-nourished. No distress.  HENT:  Head: Normocephalic and atraumatic.  Eyes: Conjunctivae and EOM are normal. Pupils are equal, round, and reactive to light.  Cardiovascular: Normal rate, regular rhythm and normal heart sounds.   Pulmonary/Chest: Effort normal and breath sounds normal. No respiratory distress. He has no wheezes.  Musculoskeletal: Normal range of motion.  Neurological: He is alert. He has normal strength. He displays no atrophy. A sensory deficit is present. No cranial nerve deficit. He exhibits normal muscle tone.    Numbness on right ulnar surface. Negative phalens's  Skin: Skin is warm and dry.  Psychiatric: He has a normal mood and affect. His behavior is normal.  Nursing note and vitals reviewed.       Assessment & Plan:   1. Numbness of right hand History of repetitive hand movement job/power tool - Ambulatory referral to Hand Surgery  2. Tinnitus of both ears chronic - Ambulatory referral to Neurology  3. Essential hypertension - hydrochlorothiazide (HYDRODIURIL) 25 MG tablet; Take 2 tablets (50 mg total) by mouth daily.  Dispense: 180 tablet; Refill: 3   Continue all other maintenance medications as listed above.  Follow up plan: Keep regular follow up as needed  Educational handout given for meniere  Remus LofflerAngel S. Teige Rountree PA-C [facility] [address]  [place], [place] [address] [phone]   08/29/2016, 8:19 PM

## 2016-08-30 DIAGNOSIS — R200 Anesthesia of skin: Secondary | ICD-10-CM | POA: Insufficient documentation

## 2016-08-30 DIAGNOSIS — M79602 Pain in left arm: Secondary | ICD-10-CM | POA: Insufficient documentation

## 2016-08-30 DIAGNOSIS — M79601 Pain in right arm: Secondary | ICD-10-CM | POA: Insufficient documentation

## 2016-09-19 ENCOUNTER — Other Ambulatory Visit: Payer: Self-pay | Admitting: Family

## 2016-09-20 ENCOUNTER — Telehealth: Payer: Self-pay | Admitting: Family

## 2016-09-20 DIAGNOSIS — I10 Essential (primary) hypertension: Secondary | ICD-10-CM

## 2016-09-20 NOTE — Telephone Encounter (Signed)
Pt's BP at visit has been at goal. I will place a referral to cardiologists, however, they usually do not adjust BP medications. Pt had a stress test on 01/25/16.

## 2016-09-20 NOTE — Addendum Note (Signed)
Addended by: Jannifer RodneyHAWKS, Brigit Doke A on: 09/20/2016 05:02 PM   Modules accepted: Orders

## 2016-09-20 NOTE — Telephone Encounter (Signed)
What type of referral do you need? cardiology  Have you been seen at our office for this problem? High bp (If no, schedule them an appointment.  They will need to be seen before a referral can be done.)  Is there a particular doctor or location that you prefer? He will call back with a name.  Patient notified that referrals can take up to a week or longer to process. If they haven't heard anything within a week they should call back and speak with the referral department.

## 2016-09-20 NOTE — Telephone Encounter (Signed)
Why does pt need to see Cardiologists? BP looks stable.

## 2016-09-20 NOTE — Telephone Encounter (Deleted)
Pt states he has been seen her

## 2016-09-20 NOTE — Telephone Encounter (Signed)
Pt called back to say he would like to [facility]. [place]

## 2016-09-20 NOTE — Telephone Encounter (Signed)
Pt states he has been seen here on multiple occasions and his BP is not under control. Last few nights his diastolic # has been @ 115. Pt states he can not afford to keep coming here and paying co-pays and his issue is not being resolved. States he even had to go to ED which is a $500 co-pay.

## 2016-09-25 ENCOUNTER — Other Ambulatory Visit: Payer: Self-pay | Admitting: Family

## 2016-09-25 ENCOUNTER — Ambulatory Visit: Payer: BLUE CROSS/BLUE SHIELD | Admitting: Neurology

## 2016-09-25 DIAGNOSIS — K219 Gastro-esophageal reflux disease without esophagitis: Secondary | ICD-10-CM

## 2016-10-03 ENCOUNTER — Telehealth: Payer: Self-pay | Admitting: Family

## 2016-10-03 NOTE — Telephone Encounter (Signed)
Pt will check with [facility] again, new Rx with new sig was sent to them on 08/27/16

## 2016-10-03 NOTE — Telephone Encounter (Signed)
What is the name of the medication? hctz  Have you contacted your pharmacy to request a refill? No. Christy doubled his dose and has run out early. Needs new rx.   Which [facility] [place]   Patient notified that their request is being sent to the clinical staff for review and that they should receive a call once it is complete. If they do not receive a call within 24 hours they can check with their pharmacy or our office.

## 2016-10-04 NOTE — Progress Notes (Signed)
Referring: Prudy Feeler PA-C  HPI: 46 yo male for evaluation of hypertension. ETT 7/17 normal. Patient has some dyspnea on exertion but no orthopnea, PND or pedal edema. He does not have exertional chest pain although occasional vague discomfort at night. No syncope. He was diagnosed with hypertension approximately 9 months ago and his blood pressure continues to be high. Cardiology asked to evaluate.  Current Outpatient Prescriptions  Medication Sig Dispense Refill  . hydrochlorothiazide (HYDRODIURIL) 25 MG tablet Take 2 tablets (50 mg total) by mouth daily. 180 tablet 3  . loratadine (CLARITIN) 10 MG tablet Take 1 tablet (10 mg total) by mouth daily. 30 tablet 11  . pantoprazole (PROTONIX) 20 MG tablet TAKE 1 TABLET (20 MG TOTAL) BY MOUTH DAILY. 90 tablet 1  . rosuvastatin (CRESTOR) 10 MG tablet Take 1 tablet (10 mg total) by mouth daily. 30 tablet 5  . Vitamin D, Ergocalciferol, (DRISDOL) 50000 units CAPS capsule TAKE 1 CAPSULE (50,000 UNITS TOTAL) BY MOUTH EVERY 7 (SEVEN) DAYS. 12 capsule 0   No current facility-administered medications for this visit.     Allergies  Allergen Reactions  . Lisinopril Swelling  . Penicillins Rash     Past Medical History:  Diagnosis Date  . GERD (gastroesophageal reflux disease)   . Hyperlipidemia   . Hypertension   . Thyroid disease     Past Surgical History:  Procedure Laterality Date  . No prior surgery      Social History   Social History  . Marital status: Married    Spouse name: N/A  . Number of children: 2  . Years of education: N/A   Occupational History  . Not on file.   Social History Main Topics  . Smoking status: Never Smoker  . Smokeless tobacco: Never Used  . Alcohol use No  . Drug use: No  . Sexual activity: Not on file   Other Topics Concern  . Not on file   Social History Narrative  . No narrative on file    Family History  Problem Relation Age of Onset  . Heart disease Mother 58  . Diabetes  Mother   . Cancer Mother   . Aneurysm Father 40    ROS: no fevers or chills, productive cough, hemoptysis, dysphasia, odynophagia, melena, hematochezia, dysuria, hematuria, rash, seizure activity, orthopnea, PND, pedal edema, claudication. Remaining systems are negative.  Physical Exam:   Blood pressure (!) 160/108, pulse 61, height 6' (1.829 m), weight 246 lb 6.4 oz (111.8 kg).  General:  Well developed/well nourished in NAD Skin warm/dry Patient not depressed No peripheral clubbing Back-normal HEENT-normal/normal eyelids Neck supple/normal carotid upstroke bilaterally; no JVD; no thyromegaly; no bruits chest - CTA/ normal expansion CV - RRR/normal S1 and S2; no murmurs, rubs or gallops;  PMI nondisplaced Abdomen -NT/ND, no HSM, no mass, + bowel sounds, no bruit 2+ femoral pulses, no bruits Ext-no edema, chords, 2+ DP Neuro-grossly nonfocal  ECG - sinus rhythm at a rate of 61. No ST changes. personally reviewed  A/P  1 hypertension-blood pressure is elevated. He has an allergy to lisinopril. Decrease hydrochlorothiazide to 25 mg daily. Check potassium and renal function. Add amlodipine 5 mg daily. I have asked him to track this at home and we will increase amlodipine or add additional medications as needed. He will also purchase a blood pressure cuff and bring to next office visit to correlate with ours. Given recent onset schedule renal Dopplers to exclude renal artery stenosis.  2 dyspnea-some dyspnea  on exertion. Schedule echocardiogram to assess LV systolic and diastolic function as well as LV wall thickness.  3 Chest pain-symptoms atypical. Recent treadmill normal. No further ischemia evaluation.   4 Hyperlipidemia-continue statin. Management per primary care.   Olga MillersBrian Tallan Sandoz, MD

## 2016-10-09 ENCOUNTER — Encounter: Payer: Self-pay | Admitting: Cardiology

## 2016-10-09 ENCOUNTER — Ambulatory Visit (INDEPENDENT_AMBULATORY_CARE_PROVIDER_SITE_OTHER): Payer: BLUE CROSS/BLUE SHIELD | Admitting: Cardiology

## 2016-10-09 VITALS — BP 160/108 | HR 61 | Ht 72.0 in | Wt 246.4 lb

## 2016-10-09 DIAGNOSIS — R0602 Shortness of breath: Secondary | ICD-10-CM

## 2016-10-09 DIAGNOSIS — R072 Precordial pain: Secondary | ICD-10-CM | POA: Diagnosis not present

## 2016-10-09 DIAGNOSIS — I10 Essential (primary) hypertension: Secondary | ICD-10-CM | POA: Diagnosis not present

## 2016-10-09 MED ORDER — HYDROCHLOROTHIAZIDE 25 MG PO TABS: 25.0000 mg | ORAL_TABLET | Freq: Every day | ORAL | 3 refills | Status: DC

## 2016-10-09 MED ORDER — AMLODIPINE BESYLATE 5 MG PO TABS: 5.0000 mg | ORAL_TABLET | Freq: Every day | ORAL | 3 refills | Status: DC

## 2016-10-09 NOTE — Patient Instructions (Signed)
Medication Instructions:   DECREASE HCTZ TO 25 MG ONCE DAILY  START AMLODIPINE 5 MG ONCE DAILY  Labwork:  Your physician recommends that you HAVE LAB WORK TODAY  Testing/Procedures:  Your physician has requested that you have an echocardiogram. Echocardiography is a painless test that uses sound waves to create images of your heart. It provides your doctor with information about the size and shape of your heart and how well your heart's chambers and valves are working. This procedure takes approximately one hour. There are no restrictions for this procedure.   Your physician has requested that you have a renal artery duplex. During this test, an ultrasound is used to evaluate blood flow to the kidneys. Allow one hour for this exam. Do not eat after midnight the day before and avoid carbonated beverages. Take your medications as you usually do.    Follow-Up:  Your physician recommends that you schedule a follow-up appointment in: 8-12 WEEKS WITH DR Jens SomRENSHAW

## 2016-10-10 LAB — BASIC METABOLIC PANEL
BUN: 19 mg/dL (ref 7–25)
CO2: 26 mmol/L (ref 20–31)
CREATININE: 1.14 mg/dL (ref 0.60–1.35)
Calcium: 9.5 mg/dL (ref 8.6–10.3)
Chloride: 100 mmol/L (ref 98–110)
Glucose, Bld: 90 mg/dL (ref 65–99)
POTASSIUM: 3.9 mmol/L (ref 3.5–5.3)
Sodium: 141 mmol/L (ref 135–146)

## 2016-10-16 ENCOUNTER — Other Ambulatory Visit: Payer: Self-pay | Admitting: Family

## 2016-10-24 ENCOUNTER — Ambulatory Visit (HOSPITAL_BASED_OUTPATIENT_CLINIC_OR_DEPARTMENT_OTHER): Admission: RE | Admit: 2016-10-24 | Payer: BLUE CROSS/BLUE SHIELD | Source: Ambulatory Visit

## 2016-10-24 ENCOUNTER — Ambulatory Visit (HOSPITAL_BASED_OUTPATIENT_CLINIC_OR_DEPARTMENT_OTHER): Payer: BLUE CROSS/BLUE SHIELD

## 2016-11-07 ENCOUNTER — Ambulatory Visit (HOSPITAL_BASED_OUTPATIENT_CLINIC_OR_DEPARTMENT_OTHER): Payer: BLUE CROSS/BLUE SHIELD

## 2016-11-19 ENCOUNTER — Other Ambulatory Visit: Payer: Self-pay | Admitting: Family

## 2016-12-02 NOTE — Progress Notes (Deleted)
      HPI: FU hypertension. ETT 7/17 normal. Echocardiogram and renal Dopplers ordered at last office visit but not performed. Since last seen  Current Outpatient Prescriptions  Medication Sig Dispense Refill  . amLODipine (NORVASC) 5 MG tablet Take 1 tablet (5 mg total) by mouth daily. 90 tablet 3  . hydrochlorothiazide (HYDRODIURIL) 25 MG tablet Take 1 tablet (25 mg total) by mouth daily. 180 tablet 3  . loratadine (CLARITIN) 10 MG tablet Take 1 tablet (10 mg total) by mouth daily. 30 tablet 11  . pantoprazole (PROTONIX) 20 MG tablet TAKE 1 TABLET (20 MG TOTAL) BY MOUTH DAILY. 90 tablet 1  . rosuvastatin (CRESTOR) 10 MG tablet Take 1 tablet (10 mg total) by mouth daily. 30 tablet 5  . rosuvastatin (CRESTOR) 20 MG tablet TAKE 1 TABLET (20 MG TOTAL) BY MOUTH DAILY. 90 tablet 1  . Vitamin D, Ergocalciferol, (DRISDOL) 50000 units CAPS capsule TAKE 1 CAPSULE (50,000 UNITS TOTAL) BY MOUTH EVERY 7 (SEVEN) DAYS. 12 capsule 0   No current facility-administered medications for this visit.      Past Medical History:  Diagnosis Date  . GERD (gastroesophageal reflux disease)   . Hyperlipidemia   . Hypertension   . Thyroid disease     Past Surgical History:  Procedure Laterality Date  . No prior surgery      Social History   Social History  . Marital status: Married    Spouse name: N/A  . Number of children: 2  . Years of education: N/A   Occupational History  . Not on file.   Social History Main Topics  . Smoking status: Never Smoker  . Smokeless tobacco: Never Used  . Alcohol use No  . Drug use: No  . Sexual activity: Not on file   Other Topics Concern  . Not on file   Social History Narrative  . No narrative on file    Family History  Problem Relation Age of Onset  . Heart disease Mother 6950  . Diabetes Mother   . Cancer Mother   . Aneurysm Father 40    ROS: no fevers or chills, productive cough, hemoptysis, dysphasia, odynophagia, melena, hematochezia,  dysuria, hematuria, rash, seizure activity, orthopnea, PND, pedal edema, claudication. Remaining systems are negative.  Physical Exam: Well-developed well-nourished in no acute distress.  Skin is warm and dry.  HEENT is normal.  Neck is supple.  Chest is clear to auscultation with normal expansion.  Cardiovascular exam is regular rate and rhythm.  Abdominal exam nontender or distended. No masses palpated. Extremities show no edema. neuro grossly intact  ECG- personally reviewed  A/P  1  Steve MillersBrian Crenshaw, MD

## 2016-12-04 ENCOUNTER — Ambulatory Visit: Payer: BLUE CROSS/BLUE SHIELD | Admitting: Cardiology

## 2017-02-09 ENCOUNTER — Other Ambulatory Visit: Payer: Self-pay | Admitting: Family

## 2017-02-24 ENCOUNTER — Encounter: Payer: Self-pay | Admitting: Family Medicine

## 2017-02-24 ENCOUNTER — Ambulatory Visit (INDEPENDENT_AMBULATORY_CARE_PROVIDER_SITE_OTHER): Payer: Commercial Managed Care - PPO | Admitting: Family Medicine

## 2017-02-24 VITALS — BP 135/87 | HR 62 | Temp 97.0°F | Ht 72.0 in | Wt 253.0 lb

## 2017-02-24 DIAGNOSIS — R040 Epistaxis: Secondary | ICD-10-CM

## 2017-02-24 NOTE — Progress Notes (Signed)
BP 135/87   Pulse 62   Temp (!) 97 F (36.1 C) (Oral)   Ht 6' (1.829 m)   Wt 253 lb (114.8 kg)   BMI 34.31 kg/m    Subjective:    Patient ID: Steve Conner, male    DOB: [birth year], 46 y.o.   MRN: [id number]  HPI: Steve Conner is a 46 y.o. male presenting on 02/24/2017 for Nosebleeds (began about 10 days ago, sporadic episodes, both sides of nostrils)   HPI Frequent nosebleeds Patient has been having frequent nosebleeds over the past 10 days. He has had sporadic episodes of which the longest lasted a couple hours. He says it's been coming from both nostrils and they alternate. He denies any sneezing or cough or sinus pressure over the past 10 days. He denies any fevers or chills but does say that he has been having some allergy symptoms of congestion. He denies any shortness of breath or wheezing or lightheadedness or dizziness or chest pain.  Relevant past medical, surgical, family and social history reviewed and updated as indicated. Interim medical history since our last visit reviewed. Allergies and medications reviewed and updated.  Review of Systems  Constitutional: Negative for chills, fatigue and fever.  HENT: Positive for congestion and nosebleeds. Negative for facial swelling, postnasal drip, rhinorrhea, sinus pain, sinus pressure, sneezing and sore throat.   Eyes: Negative for discharge.  Respiratory: Negative for cough, shortness of breath and wheezing.   Cardiovascular: Negative for chest pain and leg swelling.  Musculoskeletal: Negative for back pain and gait problem.  Skin: Negative for rash.  Neurological: Negative for dizziness and light-headedness.  All other systems reviewed and are negative.   Per HPI unless specifically indicated above        Objective:    BP 135/87   Pulse 62   Temp (!) 97 F (36.1 C) (Oral)   Ht 6' (1.829 m)   Wt 253 lb (114.8 kg)   BMI 34.31 kg/m   Wt Readings from Last 3 Encounters:  02/24/17 253 lb (114.8 kg)  10/09/16  246 lb 6.4 oz (111.8 kg)  08/27/16 249 lb 12.8 oz (113.3 kg)    Physical Exam  Constitutional: He is oriented to person, place, and time. He appears well-developed and well-nourished. No distress.  HENT:  Right Ear: External ear normal.  Left Ear: External ear normal.  Nose: Mucosal edema present. No rhinorrhea, sinus tenderness or nasal deformity. No epistaxis (No bleeding today in the office but he does have a little bit of dry blood in the right neck). Right sinus exhibits no maxillary sinus tenderness and no frontal sinus tenderness. Left sinus exhibits no maxillary sinus tenderness and no frontal sinus tenderness.  Mouth/Throat: Oropharynx is clear and moist. No oropharyngeal exudate.  Eyes: Conjunctivae are normal. No scleral icterus.  Neck: Neck supple. No thyromegaly present.  Cardiovascular: Normal rate, regular rhythm, normal heart sounds and intact distal pulses.   No murmur heard. Pulmonary/Chest: Effort normal and breath sounds normal. No respiratory distress. He has no wheezes. He has no rales.  Musculoskeletal: Normal range of motion. He exhibits no edema.  Lymphadenopathy:    He has no cervical adenopathy.  Neurological: He is alert and oriented to person, place, and time. Coordination normal.  Skin: Skin is warm and dry. No rash noted. He is not diaphoretic.  Psychiatric: He has a normal mood and affect. His behavior is normal.  Nursing note and vitals reviewed.       Assessment &  Plan:   Problem List Items Addressed This Visit    None    Visit Diagnoses    Frequent nosebleeds    -  Primary   Use nasal saline, will do CBC and ENT referral   Relevant Orders   CBC with Differential/Platelet (Completed)   Ambulatory referral to ENT       Follow up plan: Return if symptoms worsen or fail to improve.  Counseling provided for all of the vaccine components Orders Placed This Encounter  Procedures  . CBC with Differential/Platelet  . Ambulatory referral to ENT      Arville CareJoshua Dettinger, MD [facility] 02/24/2017, 3:46 PM

## 2017-02-25 LAB — CBC WITH DIFFERENTIAL/PLATELET
BASOS ABS: 0 10*3/uL (ref 0.0–0.2)
Basos: 0 %
EOS (ABSOLUTE): 0.3 10*3/uL (ref 0.0–0.4)
Eos: 3 %
Hematocrit: 38.6 % (ref 37.5–51.0)
Hemoglobin: 12.7 g/dL — ABNORMAL LOW (ref 13.0–17.7)
Immature Grans (Abs): 0 10*3/uL (ref 0.0–0.1)
Immature Granulocytes: 0 %
LYMPHS ABS: 2.7 10*3/uL (ref 0.7–3.1)
LYMPHS: 28 %
MCH: 26.6 pg (ref 26.6–33.0)
MCHC: 32.9 g/dL (ref 31.5–35.7)
MCV: 81 fL (ref 79–97)
Monocytes Absolute: 0.7 10*3/uL (ref 0.1–0.9)
Monocytes: 7 %
NEUTROS ABS: 5.8 10*3/uL (ref 1.4–7.0)
Neutrophils: 62 %
PLATELETS: 208 10*3/uL (ref 150–379)
RBC: 4.78 x10E6/uL (ref 4.14–5.80)
RDW: 14.9 % (ref 12.3–15.4)
WBC: 9.5 10*3/uL (ref 3.4–10.8)

## 2017-04-25 ENCOUNTER — Ambulatory Visit (INDEPENDENT_AMBULATORY_CARE_PROVIDER_SITE_OTHER): Payer: Commercial Managed Care - PPO | Admitting: Family Medicine

## 2017-04-25 ENCOUNTER — Encounter: Payer: Self-pay | Admitting: Family Medicine

## 2017-04-25 VITALS — BP 138/93 | HR 62 | Temp 97.2°F | Ht 73.0 in | Wt 257.6 lb

## 2017-04-25 DIAGNOSIS — H6122 Impacted cerumen, left ear: Secondary | ICD-10-CM | POA: Diagnosis not present

## 2017-04-25 NOTE — Progress Notes (Signed)
   HPI  Patient presents today here with left ear pain.  Patient explains that he's had left ear pain and discomfort for 1 week with the sensation of "stopped up ear" for about 2 days.Steve Conner He describes muffled hearing is well. He states that about this time every year he ends up with a cerumen impaction.  He denies fever, chills, sweats. He overall feels well.  PMH: Smoking status noted ROS: Per HPI  Objective: BP (!) 138/93   Pulse 62   Temp (!) 97.2 F (36.2 C) (Oral)   Ht  (1.854 m)   Wt 257 lb 9.6 oz (116.8 kg)   BMI 33.99 kg/m  Gen: NAD, alert, cooperative with exam HEENT: NCAT, left TM obscured by cerumen, right TM within normal limits with moderate cerumen CV: RRR, good S1/S2, no murmur Resp: CTABL, no wheezes, non-labored Ext: No edema, warm Neuro: Alert and oriented, No gross deficits  Assessment and plan:  # Cerumen impaction Cleaned out well today with ear irrigation, patient with good relief afterwards. Return to clinic as needed Recommended stopping using Q-tips    Murtis Sink, MD [facility] 04/25/2017, 3:52 PM

## 2017-06-12 ENCOUNTER — Telehealth: Payer: Self-pay | Admitting: Cardiology

## 2017-06-12 NOTE — Telephone Encounter (Signed)
Returned call to pt made PAOV 06-17-17 Pt will go to ER if any new sx develop or sx worsen, pt verbalizes understanding

## 2017-06-12 NOTE — Telephone Encounter (Signed)
Fu as scheduled; if sooner needed, arrange paov Olga MillersBrian Crenshaw

## 2017-06-12 NOTE — Telephone Encounter (Signed)
Returned call to pt, he states that he has had BLE and bilat swelling in his hands "for quite awhile" he states that he also have exertional SOB that does start at short distances. Pt denies any other sx cheat pain or pressure, or dizziness. Pt does not take his BP or HR. Pt does state that his weight is up his "normal" weight is 138-40 and today his weight is 157. Made next available appt for  follow up appt 09-04-17 @1140am .  Please advise if there is any changes needed.

## 2017-06-12 NOTE — Telephone Encounter (Signed)
New Message  .Pt c/o swelling: STAT is pt has developed SOB within 24 hours  1) How much weight have you gained and in what time span?   If swelling, where is the swelling located? Both feet and hands   Are you currently taking a fluid pill? Yes pt doesn't know the name   2) Are you currently SOB? no  3) Do you have a log of your daily weights (if so, list)? no  4) Have you gained 3 pounds in a day or 5 pounds in a week? no  5) Have you traveled recently? no

## 2017-06-17 ENCOUNTER — Ambulatory Visit: Payer: Commercial Managed Care - PPO | Admitting: Cardiology

## 2017-06-17 ENCOUNTER — Encounter: Payer: Self-pay | Admitting: Cardiology

## 2017-06-17 VITALS — BP 164/104 | HR 65 | Ht 72.0 in | Wt 263.0 lb

## 2017-06-17 DIAGNOSIS — R0609 Other forms of dyspnea: Secondary | ICD-10-CM

## 2017-06-17 DIAGNOSIS — E039 Hypothyroidism, unspecified: Secondary | ICD-10-CM

## 2017-06-17 DIAGNOSIS — E669 Obesity, unspecified: Secondary | ICD-10-CM | POA: Diagnosis not present

## 2017-06-17 DIAGNOSIS — R600 Localized edema: Secondary | ICD-10-CM | POA: Diagnosis not present

## 2017-06-17 DIAGNOSIS — R0683 Snoring: Secondary | ICD-10-CM | POA: Diagnosis not present

## 2017-06-17 DIAGNOSIS — I10 Essential (primary) hypertension: Secondary | ICD-10-CM

## 2017-06-17 DIAGNOSIS — R601 Generalized edema: Secondary | ICD-10-CM | POA: Diagnosis not present

## 2017-06-17 LAB — BASIC METABOLIC PANEL
BUN/Creatinine Ratio: 18 (ref 9–20)
BUN: 15 mg/dL (ref 6–24)
CO2: 23 mmol/L (ref 20–29)
Calcium: 9.2 mg/dL (ref 8.7–10.2)
Chloride: 98 mmol/L (ref 96–106)
Creatinine, Ser: 0.84 mg/dL (ref 0.76–1.27)
GFR calc Af Amer: 123 mL/min/{1.73_m2} (ref >59)
GFR calc non Af Amer: 106 mL/min/{1.73_m2} (ref >59)
Glucose: 101 mg/dL — ABNORMAL HIGH (ref 65–99)
Potassium: 3.4 mmol/L — ABNORMAL LOW (ref 3.5–5.2)
Sodium: 138 mmol/L (ref 134–144)

## 2017-06-17 LAB — TSH: TSH: 4.38 u[IU]/mL (ref 0.450–4.500)

## 2017-06-17 LAB — PRO B NATRIURETIC PEPTIDE: NT-Pro BNP: 59 pg/mL (ref 0–86)

## 2017-06-17 MED ORDER — AMLODIPINE BESYLATE 5 MG PO TABS: 5.0000 mg | ORAL_TABLET | Freq: Every day | ORAL | 3 refills | Status: DC

## 2017-06-17 MED ORDER — POTASSIUM CHLORIDE CRYS ER 20 MEQ PO TBCR: 20.0000 meq | EXTENDED_RELEASE_TABLET | Freq: Every day | ORAL | 3 refills | Status: DC

## 2017-06-17 MED ORDER — FUROSEMIDE 40 MG PO TABS: ORAL_TABLET | ORAL | 3 refills | Status: DC

## 2017-06-17 NOTE — Assessment & Plan Note (Signed)
Suspected sleep apnea

## 2017-06-17 NOTE — Progress Notes (Signed)
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 He had Conner POET in 2017 that was negative. He has an allergy to Lisinopril with edema, rash, and SOB. Dr Steve Conner suggested Amlodipine and an echo. The pt then was without  Insurance for Conner time and stopped the Amlodipine and never got the echo. He is in the office today with complaints of LE edema and DOE for the past 3-4 months. He notes he has gained 30 lbs over the past year though he has tried to watch his diet. He has LE edema at his sock line. He denies orthopnea. He tells me his wife says he snores "bad". He does have DOE "just walking across the room". He denies chest pain.   Current Outpatient Medications  Medication Sig Dispense Refill  . levothyroxine (SYNTHROID, LEVOTHROID) 25 MCG tablet Take 25 mcg by mouth daily before breakfast.    . pantoprazole (PROTONIX) 20 MG tablet TAKE 1 TABLET (20 MG TOTAL) BY MOUTH DAILY. 90 tablet 1  . rosuvastatin (CRESTOR) 20 MG tablet TAKE 1 TABLET (20 MG TOTAL) BY MOUTH DAILY. 90 tablet 1  . Vitamin D, Ergocalciferol, (DRISDOL) 50000 units CAPS capsule TAKE 1 CAPSULE (50,000 UNITS TOTAL) BY MOUTH EVERY 7 (SEVEN) DAYS. 12 capsule 0  . amLODipine (NORVASC) 5 MG tablet Take 1 tablet (5 mg total) by mouth daily. 90 tablet 3  . furosemide (LASIX) 40 MG tablet Take 1 tablet twice daily for 3 days then decrease to 1 tablet daily 93 tablet 3  . potassium chloride SA (K-DUR,KLOR-CON) 20 MEQ tablet Take 1 tablet (20 mEq total) by mouth daily. 90 tablet 3   No current facility-administered medications for this visit.     Allergies  Allergen Reactions  . Lisinopril Swelling  . Penicillins Rash    Past Medical History:  Diagnosis Date  . GERD (gastroesophageal reflux disease)   . Hyperlipidemia   . Hypertension   . Thyroid  disease     Social History   Socioeconomic History  . Marital status: Married    Spouse name: Not on file  . Number of children: 2  . Years of education: Not on file  . Highest education level: Not on file  Social Needs  . Financial resource strain: Not on file  . Food insecurity - worry: Not on file  . Food insecurity - inability: Not on file  . Transportation needs - medical: Not on file  . Transportation needs - non-medical: Not on file  Occupational History  . Not on file  Tobacco Use  . Smoking status: Never Smoker  . Smokeless tobacco: Never Used  Substance and Sexual Activity  . Alcohol use: No  . Drug use: No  . Sexual activity: Not on file  Other Topics Concern  . Not on file  Social History Narrative  . Not on file     Family History  Problem Relation Age of Onset  . Heart disease Mother 4150  . Diabetes Mother   . Cancer Mother   . Aneurysm Father 40     Review of Systems: General: negative for chills, fever, night sweats or weight changes.  Cardiovascular: negative for chest pain, orthopnea, palpitations, paroxysmal nocturnal dyspnea or shortness of breath Dermatological: negative for rash Respiratory: negative for cough or wheezing Urologic: negative for  hematuria Abdominal: negative for nausea, vomiting, diarrhea, bright red blood per rectum, melena, or hematemesis Neurologic: negative for visual changes, syncope, or dizziness All other systems reviewed and are otherwise negative except as noted above.    Blood pressure (!) 164/104, pulse 65, height 6' (1.829 m), weight 263 lb (119.3 kg).  General appearance: alert, cooperative, no distress and moderately obese Neck: no carotid bruit, no JVD and thick neck Lungs: clear to auscultation bilaterally Heart: regular rate and rhythm Abdomen: obese, ventral hernia Extremities: 1+ edema Pulses: 2+ and symmetric Skin: Skin color, texture, turgor normal. No rashes or lesions Neurologic: Grossly  normal  EKG NSR, (no significant LVH)  ASSESSMENT AND PLAN:   Edema of both lower extremities Bilateral LE edema- r/o CHF  Essential hypertension Uncontrolled  Obesity (BMI 30.0-34.9) Suspected sleep apnea  Hypothyroidism Check TSH   PLAN  I suggested we proceed with labs- BNP BMP TSH echo and sleep study. I stopped his HCTZ and placed him on Lasix 40 mg BID x 3 days then Lasix 40 mg daily along with KCL 20 meq. I'll put him back on Amlodipine 5 mg and see him back after his echo.   Steve ShelterLuke Ailene Royal PA-C 06/17/2017 8:26 AM

## 2017-06-17 NOTE — Assessment & Plan Note (Signed)
Bilateral LE edema- r/o CHF

## 2017-06-17 NOTE — Assessment & Plan Note (Signed)
Check TSH 

## 2017-06-17 NOTE — Assessment & Plan Note (Signed)
Uncontrolled 

## 2017-06-17 NOTE — Patient Instructions (Signed)
Medication Instructions:   STOP HCTZ  START FUROSEMIDE 40 MG 1 TABLET TWICE DAILY X 3 DAYS THEN DECREASE TO 1 TABLET DAILY  START POTASSIUM CHLORIDE 20 MEQ 1 TABLET DAILY  START AMLODIPINE 5 MG ONCE DAILY  Labwork:  Your physician recommends that you HAVE LAB WORK TODAY  Testing/Procedures:  Your physician has requested that you have an echocardiogram. Echocardiography is a painless test that uses sound waves to create images of your heart. It provides your doctor with information about the size and shape of your heart and how well your heart's chambers and valves are working. This procedure takes approximately one hour. There are no restrictions for this procedure.   Your physician has recommended that you have a sleep study. This test records several body functions during sleep, including: brain activity, eye movement, oxygen and carbon dioxide blood levels, heart rate and rhythm, breathing rate and rhythm, the flow of air through your mouth and nose, snoring, body muscle movements, and chest and belly movement.    Follow-Up:  Your physician recommends that you schedule a follow-up appointment in: WITH LUKE KILROY ONCE ECHO COMPLETE

## 2017-06-24 ENCOUNTER — Other Ambulatory Visit (HOSPITAL_COMMUNITY): Payer: Commercial Managed Care - PPO

## 2017-06-25 ENCOUNTER — Other Ambulatory Visit: Payer: Self-pay | Admitting: *Deleted

## 2017-06-25 DIAGNOSIS — K219 Gastro-esophageal reflux disease without esophagitis: Secondary | ICD-10-CM

## 2017-06-25 MED ORDER — PANTOPRAZOLE SODIUM 20 MG PO TBEC: 20.0000 mg | DELAYED_RELEASE_TABLET | Freq: Every day | ORAL | 0 refills | Status: DC

## 2017-06-26 ENCOUNTER — Encounter: Payer: Self-pay | Admitting: *Deleted

## 2017-07-01 ENCOUNTER — Other Ambulatory Visit (HOSPITAL_COMMUNITY): Payer: Commercial Managed Care - PPO

## 2017-07-04 ENCOUNTER — Other Ambulatory Visit: Payer: Self-pay

## 2017-07-04 ENCOUNTER — Ambulatory Visit (HOSPITAL_COMMUNITY): Payer: Commercial Managed Care - PPO | Attending: Cardiology

## 2017-07-04 DIAGNOSIS — I10 Essential (primary) hypertension: Secondary | ICD-10-CM | POA: Insufficient documentation

## 2017-07-04 DIAGNOSIS — Z6835 Body mass index (BMI) 35.0-35.9, adult: Secondary | ICD-10-CM | POA: Insufficient documentation

## 2017-07-04 DIAGNOSIS — R0609 Other forms of dyspnea: Secondary | ICD-10-CM | POA: Insufficient documentation

## 2017-07-04 DIAGNOSIS — R601 Generalized edema: Secondary | ICD-10-CM | POA: Insufficient documentation

## 2017-07-04 DIAGNOSIS — E669 Obesity, unspecified: Secondary | ICD-10-CM | POA: Diagnosis not present

## 2017-07-10 ENCOUNTER — Ambulatory Visit: Payer: Commercial Managed Care - PPO | Admitting: Cardiology

## 2017-07-10 ENCOUNTER — Encounter: Payer: Self-pay | Admitting: Cardiology

## 2017-07-10 VITALS — BP 129/85 | HR 64 | Ht 72.0 in | Wt 263.2 lb

## 2017-07-10 DIAGNOSIS — I10 Essential (primary) hypertension: Secondary | ICD-10-CM | POA: Diagnosis not present

## 2017-07-10 DIAGNOSIS — Z8249 Family history of ischemic heart disease and other diseases of the circulatory system: Secondary | ICD-10-CM | POA: Diagnosis not present

## 2017-07-10 DIAGNOSIS — R079 Chest pain, unspecified: Secondary | ICD-10-CM | POA: Insufficient documentation

## 2017-07-10 DIAGNOSIS — E669 Obesity, unspecified: Secondary | ICD-10-CM

## 2017-07-10 DIAGNOSIS — R002 Palpitations: Secondary | ICD-10-CM | POA: Insufficient documentation

## 2017-07-10 DIAGNOSIS — R600 Localized edema: Secondary | ICD-10-CM | POA: Diagnosis not present

## 2017-07-10 DIAGNOSIS — R0789 Other chest pain: Secondary | ICD-10-CM | POA: Diagnosis not present

## 2017-07-10 MED ORDER — METOPROLOL SUCCINATE ER 25 MG PO TB24: 25.0000 mg | ORAL_TABLET | Freq: Every day | ORAL | 3 refills | Status: DC

## 2017-07-10 NOTE — Progress Notes (Signed)
07/10/2017 Steve LeydenKenny Conner   1970-10-30  [id number]  Primary Physician [name], [name], [name] Primary Cardiologist: Dr Jens Somrenshaw  HPI:  46 y/o male, works as in Architectural technologistmachine maintenance, seen by Dr Jens Somrenshaw March 2018 for HTN and DOE. He had a POET in 2017 that was negative. He has an allergy to Lisinopril with edema, rash, and SOB. Dr Jens Somrenshaw suggested Amlodipine and an echo. The pt then was without  Insurance for a time and stopped the Amlodipine and never got the echo. He was seen in the office 06/17/17 with complaints of LE edema and DOE for the previous 3-4 months. He noted he had gained 30 lbs over the past year though he has tried to watch his diet. He had LE edema at his sock line. He denied orthopnea. He told me his wife says he snores "bad". He does have DOE "just walking across the room". He denied chest pain then but today complains of intermittent pain across his chest. He also tell me he had a near syncopal spell while working on his truck. This lasted 30 minutes. He also notes palpitations at night that sound like PSVT.  His echo, TSH, and BNP were all normal.  His B/P is under better control today.    Current Outpatient Medications  Medication Sig Dispense Refill  . amLODipine (NORVASC) 5 MG tablet Take 1 tablet (5 mg total) by mouth daily. 90 tablet 3  . furosemide (LASIX) 40 MG tablet Take 1 tablet twice daily for 3 days then decrease to 1 tablet daily 93 tablet 3  . levothyroxine (SYNTHROID, LEVOTHROID) 25 MCG tablet Take 25 mcg by mouth daily before breakfast.    . pantoprazole (PROTONIX) 20 MG tablet Take 1 tablet (20 mg total) by mouth daily. 90 tablet 0  . potassium chloride SA (K-DUR,KLOR-CON) 20 MEQ tablet Take 1 tablet (20 mEq total) by mouth daily. 90 tablet 3  . rosuvastatin (CRESTOR) 10 MG tablet Take 10 mg by mouth every evening.  5  . Vitamin D, Ergocalciferol, (DRISDOL) 50000 units CAPS capsule TAKE 1 CAPSULE (50,000 UNITS TOTAL) BY MOUTH EVERY 7 (SEVEN) DAYS. 12  capsule 0   No current facility-administered medications for this visit.     Allergies  Allergen Reactions  . Lisinopril Swelling  . Penicillins Rash    Past Medical History:  Diagnosis Date  . GERD (gastroesophageal reflux disease)   . Hyperlipidemia   . Hypertension   . Thyroid disease     Social History   Socioeconomic History  . Marital status: Married    Spouse name: Not on file  . Number of children: 2  . Years of education: Not on file  . Highest education level: Not on file  Social Needs  . Financial resource strain: Not on file  . Food insecurity - worry: Not on file  . Food insecurity - inability: Not on file  . Transportation needs - medical: Not on file  . Transportation needs - non-medical: Not on file  Occupational History  . Not on file  Tobacco Use  . Smoking status: Never Smoker  . Smokeless tobacco: Never Used  Substance and Sexual Activity  . Alcohol use: No  . Drug use: No  . Sexual activity: Not on file  Other Topics Concern  . Not on file  Social History Narrative  . Not on file     Family History  Problem Relation Age of Onset  . Heart disease Mother 2550  . Diabetes Mother   .  Cancer Mother   . Aneurysm Father 40     Review of Systems: General: negative for chills, fever, night sweats or weight changes.  Cardiovascular: negative for chest pain, dyspnea on exertion, edema, orthopnea, palpitations, paroxysmal nocturnal dyspnea or shortness of breath Dermatological: negative for rash Respiratory: negative for cough or wheezing Urologic: negative for hematuria Abdominal: negative for nausea, vomiting, diarrhea, bright red blood per rectum, melena, or hematemesis Neurologic: negative for visual changes, syncope, or dizziness All other systems reviewed and are otherwise negative except as noted above.    Blood pressure 129/85, pulse 64, height 6' (1.829 m), weight 263 lb 3.2 oz (119.4 kg).  General appearance: alert, cooperative  and no distress Neck: no carotid bruit and no JVD Lungs: clear to auscultation bilaterally Heart: regular rate and rhythm Extremities: extremities normal, atraumatic, no cyanosis or edema Skin: Skin color, texture, turgor normal. No rashes or lesions Neurologic: Grossly normal   ASSESSMENT AND PLAN:   Chest pain of uncertain etiology Pt complains of chest tightness  Edema of both lower extremities This is not significant- BNP and echo normal  Essential hypertension Under better control since he resumed Norvasc and I added Lasix  Obesity (BMI 30.0-34.9) Sleep study is scheduled for Jan 4th  Palpitations This is a new complaint. Check Holter  Family history of coronary artery disease in mother Mother has CAD in her 3150's   PLAN  I added Toprol 12.5 mg daily. Check coronary CTA. Keep apt for sleep study. F/U with Dr Jens Somrenshaw after the above.   Corine ShelterLuke Selah Klang PA-C 07/10/2017 3:47 PM

## 2017-07-10 NOTE — Assessment & Plan Note (Signed)
This is a new complaint. Check Holter

## 2017-07-10 NOTE — Assessment & Plan Note (Signed)
Pt complains of chest tightness  

## 2017-07-10 NOTE — Assessment & Plan Note (Signed)
This is not significant- BNP and echo normal

## 2017-07-10 NOTE — Assessment & Plan Note (Signed)
Sleep study is scheduled for Jan 4th

## 2017-07-10 NOTE — Patient Instructions (Addendum)
Medication Instructions:  START METOPROLOL 12.5MG  (1/2-25MG  TABLET)  If you need a refill on your cardiac medications before your next appointment, please call your pharmacy.  Testing/Procedures: Your physician has requested that you have cardiac CT. Cardiac computed tomography (CT) is a painless test that uses an x-ray machine to take clear, detailed pictures of your heart. For further information please visit [email or web address]. Please follow instruction sheet as given.  Follow-Up: Your physician wants you to follow-up in: AS SCHEDULED WITH DR CRENSHAW.   Thank you for choosing [facility] at [facility]!!    Please arrive at the [facility] main entrance of [facility] at  AM (30-45 minutes prior to test start time)  [facility] [address] [place], [place] [address] [phone]  Proceed to the [facility] Radiology Department (First Floor).  Please follow these instructions carefully (unless otherwise directed):  Hold all erectile dysfunction medications at least 48 hours prior to test.  On the Night Before the Test: Drink plenty of water. Do not consume any caffeinated/decaffeinated beverages or chocolate 12 hours prior to your test. Do not take any antihistamines 12 hours prior to your test.  On the Day of the Test: Drink plenty of water. Do not drink any water within one hour of the test. Do not eat any food 4 hours prior to the test. You may take your regular medications prior to the test.  HOLD Furosemide morning of the test.  After the Test: 1. Drink plenty of water. 2. After receiving IV contrast, you may experience a mild flushed feeling. This is normal. On occasion, you may experience a mild rash up to 24 hours after the test. This is not dangerous. ----If this occurs, you can take Benadryl 25 mg and increase your fluid intake. If you experience trouble breathing, this can be serious. If it is severe call 911 IMMEDIATELY! If it is  mild, please call our office. If you take any of these medications: Glipizide/Metformin, Avandament, Glucavance, please do not take 48 hours after completing test.

## 2017-07-10 NOTE — Assessment & Plan Note (Signed)
Mother has CAD in her 7050's

## 2017-07-10 NOTE — Assessment & Plan Note (Signed)
Under better control since he resumed Norvasc and I added Lasix

## 2017-07-16 ENCOUNTER — Telehealth: Payer: Self-pay | Admitting: Cardiology

## 2017-07-16 NOTE — Telephone Encounter (Signed)
Left message for the patient, his appointment is to follow up after his cardiac CT. He is to call back with questions.

## 2017-07-16 NOTE — Telephone Encounter (Signed)
New message  Pt verbalized that he is calling for the rn  He has an appt on 09-04-2017 with Dr.Crenshaw and it says fu  Patient want to know what is this a follow up for and do he need to come in

## 2017-07-18 ENCOUNTER — Encounter (HOSPITAL_BASED_OUTPATIENT_CLINIC_OR_DEPARTMENT_OTHER): Payer: Commercial Managed Care - PPO

## 2017-07-31 ENCOUNTER — Other Ambulatory Visit: Payer: Self-pay | Admitting: Family

## 2017-07-31 NOTE — Telephone Encounter (Signed)
Last lipid 07/01/16

## 2017-08-14 ENCOUNTER — Ambulatory Visit (HOSPITAL_COMMUNITY): Payer: Commercial Managed Care - PPO

## 2017-08-21 ENCOUNTER — Telehealth: Payer: Self-pay | Admitting: Cardiology

## 2017-08-21 DIAGNOSIS — R601 Generalized edema: Secondary | ICD-10-CM

## 2017-08-21 NOTE — Telephone Encounter (Signed)
Pt c/o swelling: STAT is pt has developed SOB within 24 hours  1) How much weight have you gained and in what time span? 20 lbs in between 8 months   2) If swelling, where is the swelling located? Right leg   3) Are you currently taking a fluid pill? yes  4) Are you currently SOB? no  5) Do you have a log of your daily weights (if so, list)?  6) Have you gained 3 pounds in a day or 5 pounds in a week?   7) Have you traveled recently? No  Patient states that he is at work so if he does not have a signal, he will call back. Patient wanted to make you aware

## 2017-08-21 NOTE — Telephone Encounter (Signed)
Spoke with pt, aware he can take an extra 40 mg of furosemide for the next 3 days. He will come to the office next week for bmp. Lab orders mailed to the pt

## 2017-08-21 NOTE — Telephone Encounter (Signed)
Continue Lasix 40 mg daily with an additional 40 mg for worsening edema or weight gain of 2-3 pounds.  Check potassium and renal function.  Follow-up with me as scheduled.

## 2017-08-21 NOTE — Telephone Encounter (Signed)
Called patient about his message. Patient complaining of lower extremity edema, SOB with activity and chest ache sometimes. Patient stated he has been taking all his medications as prescribed. Patient stated his right lower extremity is more edematous and has some pitting. Patient denies any pain in his BLE. Patient saw Corine ShelterLuke Kilroy PA 6 weeks ago. Patient was suppose to get a coronary CTA and follow up with Dr. Jens Somrenshaw. Patient has an appointment with Dr. Jens Somrenshaw on 09/04/17. Patient stated he could not afford the CTA, so he will not be getting CTA done. Will send message to Dr. Jens Somrenshaw and his nurse for further advisement.

## 2017-08-25 NOTE — Progress Notes (Deleted)
      HPI: FU hypertension. ETT 7/17 normal.   Echocardiogram December 2018 showed normal LV function, mild left ventricular enlargement.  Patient seen in the office December 2018 with complaints of lower extremity edema and dyspnea. He was not taking his medications.  Treated with lasix and meds resumed. Coronary CTA and Holter monitor ordered but not performed.  Toprol also added for palpitations.  Since last seen  Current Outpatient Medications  Medication Sig Dispense Refill  . amLODipine (NORVASC) 5 MG tablet Take 1 tablet (5 mg total) by mouth daily. 90 tablet 3  . furosemide (LASIX) 40 MG tablet Take 1 tablet twice daily for 3 days then decrease to 1 tablet daily 93 tablet 3  . levothyroxine (SYNTHROID, LEVOTHROID) 25 MCG tablet Take 25 mcg by mouth daily before breakfast.    . metoprolol succinate (TOPROL-XL) 25 MG 24 hr tablet Take 1 tablet (25 mg total) by mouth daily. 30 tablet 3  . pantoprazole (PROTONIX) 20 MG tablet Take 1 tablet (20 mg total) by mouth daily. 90 tablet 0  . potassium chloride SA (K-DUR,KLOR-CON) 20 MEQ tablet Take 1 tablet (20 mEq total) by mouth daily. 90 tablet 3  . rosuvastatin (CRESTOR) 10 MG tablet Take 10 mg by mouth every evening.  5  . Vitamin D, Ergocalciferol, (DRISDOL) 50000 units CAPS capsule TAKE 1 CAPSULE (50,000 UNITS TOTAL) BY MOUTH EVERY 7 (SEVEN) DAYS. 12 capsule 0   No current facility-administered medications for this visit.      Past Medical History:  Diagnosis Date  . GERD (gastroesophageal reflux disease)   . Hyperlipidemia   . Hypertension   . Thyroid disease     Past Surgical History:  Procedure Laterality Date  . No prior surgery      Social History   Socioeconomic History  . Marital status: Married    Spouse name: Not on file  . Number of children: 2  . Years of education: Not on file  . Highest education level: Not on file  Social Needs  . Financial resource strain: Not on file  . Food insecurity - worry: Not on  file  . Food insecurity - inability: Not on file  . Transportation needs - medical: Not on file  . Transportation needs - non-medical: Not on file  Occupational History  . Not on file  Tobacco Use  . Smoking status: Never Smoker  . Smokeless tobacco: Never Used  Substance and Sexual Activity  . Alcohol use: No  . Drug use: No  . Sexual activity: Not on file  Other Topics Concern  . Not on file  Social History Narrative  . Not on file    Family History  Problem Relation Age of Onset  . Heart disease Mother 2850  . Diabetes Mother   . Cancer Mother   . Aneurysm Father 40    ROS: no fevers or chills, productive cough, hemoptysis, dysphasia, odynophagia, melena, hematochezia, dysuria, hematuria, rash, seizure activity, orthopnea, PND, pedal edema, claudication. Remaining systems are negative.  Physical Exam: Well-developed well-nourished in no acute distress.  Skin is warm and dry.  HEENT is normal.  Neck is supple.  Chest is clear to auscultation with normal expansion.  Cardiovascular exam is regular rate and rhythm.  Abdominal exam nontender or distended. No masses palpated. Extremities show no edema. neuro grossly intact  ECG- personally reviewed  A/P  1  Steve MillersBrian Dimonique Bourdeau, MD

## 2017-08-29 ENCOUNTER — Encounter: Payer: Self-pay | Admitting: *Deleted

## 2017-08-29 LAB — BASIC METABOLIC PANEL
BUN/Creatinine Ratio: 25 — ABNORMAL HIGH (ref 9–20)
BUN: 19 mg/dL (ref 6–24)
CO2: 20 mmol/L (ref 20–29)
CREATININE: 0.75 mg/dL — AB (ref 0.76–1.27)
Calcium: 8.9 mg/dL (ref 8.7–10.2)
Chloride: 100 mmol/L (ref 96–106)
GFR, EST AFRICAN AMERICAN: 129 mL/min/{1.73_m2} (ref >59)
GFR, EST NON AFRICAN AMERICAN: 112 mL/min/{1.73_m2} (ref >59)
Glucose: 117 mg/dL — ABNORMAL HIGH (ref 65–99)
POTASSIUM: 3.5 mmol/L (ref 3.5–5.2)
SODIUM: 140 mmol/L (ref 134–144)

## 2017-09-04 ENCOUNTER — Ambulatory Visit: Payer: Commercial Managed Care - PPO | Admitting: Cardiology

## 2017-09-16 ENCOUNTER — Encounter: Payer: Self-pay | Admitting: Physician Assistant

## 2017-09-16 ENCOUNTER — Ambulatory Visit: Payer: Commercial Managed Care - PPO | Admitting: Physician Assistant

## 2017-09-16 VITALS — BP 132/88 | HR 65 | Ht 72.0 in | Wt 270.0 lb

## 2017-09-16 DIAGNOSIS — E039 Hypothyroidism, unspecified: Secondary | ICD-10-CM | POA: Diagnosis not present

## 2017-09-16 DIAGNOSIS — Z5181 Encounter for therapeutic drug level monitoring: Secondary | ICD-10-CM | POA: Diagnosis not present

## 2017-09-16 DIAGNOSIS — E785 Hyperlipidemia, unspecified: Secondary | ICD-10-CM | POA: Diagnosis not present

## 2017-09-16 DIAGNOSIS — R600 Localized edema: Secondary | ICD-10-CM | POA: Diagnosis not present

## 2017-09-16 DIAGNOSIS — R079 Chest pain, unspecified: Secondary | ICD-10-CM | POA: Diagnosis not present

## 2017-09-16 DIAGNOSIS — I10 Essential (primary) hypertension: Secondary | ICD-10-CM

## 2017-09-16 MED ORDER — ROSUVASTATIN CALCIUM 10 MG PO TABS: 10.0000 mg | ORAL_TABLET | Freq: Every evening | ORAL | 1 refills | Status: DC

## 2017-09-16 MED ORDER — POTASSIUM CHLORIDE CRYS ER 20 MEQ PO TBCR: 20.0000 meq | EXTENDED_RELEASE_TABLET | Freq: Two times a day (BID) | ORAL | 1 refills | Status: DC

## 2017-09-16 MED ORDER — METOPROLOL SUCCINATE ER 50 MG PO TB24: 50.0000 mg | ORAL_TABLET | Freq: Every day | ORAL | 1 refills | Status: DC

## 2017-09-16 MED ORDER — FUROSEMIDE 40 MG PO TABS: 40.0000 mg | ORAL_TABLET | Freq: Two times a day (BID) | ORAL | 1 refills | Status: DC

## 2017-09-16 NOTE — Patient Instructions (Signed)
Medication Instructions:  STOP AMLODIPINE   INCREASE YOUR FUROSEMIDE TO 40 MG TWICE A DAY   INCREASE YOUR POTASSIUM TO 20 MEQ TWICE A DAY  INCREASE YOUR METOPROLOL SUC TO 50 MG DAILY   Labwork: BMET IN 1 WEEK   Testing/Procedures: Your physician has requested that you have cardiac CT. Cardiac computed tomography (CT) is a painless test that uses an x-ray machine to take clear, detailed pictures of your heart. For further information please visit [email or web address]. Please follow instruction sheet as given.  Follow-Up: Your physician recommends that you schedule a follow-up appointment in: 2-3 MONTHS WITH DR Jens SomRENSHAW   If you need a refill on your cardiac medications before your next appointment, please call your pharmacy.

## 2017-09-16 NOTE — Progress Notes (Signed)
Cardiology Office Note    Date:  09/18/2017   ID:  Steve Conner, DOB 05-01-1971, MRN [id number]  PCP:  [name], [name], [name]  Cardiologist:  Dr. Jens Somrenshaw  Chief Complaint  Patient presents with  . Follow-up    Weight gain with no desire to eat. Legs going numb.  . Edema    A lot.  . Shortness of Breath    History of Present Illness:  Steve Conner is a 47 y.o. male with PMH of HTN, HLD, hypothyroidism, and GERD.  He had POET in 2017 that was negative.  He has intolerance with lisinopril with edema, rash and shortness of breath.  Dr. Jens Somrenshaw recommended amlodipine and echocardiogram.  He never got the echo done.  He was seen by Corine ShelterLuke Kilroy PA-C on 06/17/2017 with complaint of lower extremity edema, dyspnea on exertion for 2 09-1536-month.  He also complained of a 30 pound weight gain for the past year.  Symptoms improved with Lasix.  Echocardiogram obtained on 07/04/2017 showed EF 55-60%, no significant valvular issue.  TSH was normal in December.  He was last seen by Corine ShelterLuke Kilroy on 07/10/2017, it was recommended for the patient to have a coronary CTA, it was also recommended for him to undergo sleep study as well.  He complained of chest pain and palpitation.  There was some mentioning of Holter monitor as well.  So far neither the coronary CTA, nor the sleep study or the Holter monitor has been done.  Based on telephone note on 08/21/2017, patient was complaining of worsening lower extremity edema, shortness of breath with activity and chest discomfort.  Since he was last seen, he continued to have occasional chest discomfort.  This occurs mainly at rest, given the lack of exertional component, I would recommend a coronary CT to further assess.  He also has been complaining of increased weight and lower extremity edema.  His weight has increased by 7 pounds since December of last year.  I will increase his Lasix to 40 mg twice daily and potassium supplement to 20 mEq twice daily.  Fortunately, his  lung is clear at this time.  He denies any orthopnea or PND.   Past Medical History:  Diagnosis Date  . GERD (gastroesophageal reflux disease)   . Hyperlipidemia   . Hypertension   . Thyroid disease     Past Surgical History:  Procedure Laterality Date  . No prior surgery      Current Medications: Outpatient Medications Prior to Visit  Medication Sig Dispense Refill  . levothyroxine (SYNTHROID, LEVOTHROID) 25 MCG tablet Take 25 mcg by mouth daily before breakfast.    . pantoprazole (PROTONIX) 20 MG tablet Take 1 tablet (20 mg total) by mouth daily. 90 tablet 0  . Vitamin D, Ergocalciferol, (DRISDOL) 50000 units CAPS capsule TAKE 1 CAPSULE (50,000 UNITS TOTAL) BY MOUTH EVERY 7 (SEVEN) DAYS. 12 capsule 0  . furosemide (LASIX) 40 MG tablet Take 1 tablet twice daily for 3 days then decrease to 1 tablet daily 93 tablet 3  . metoprolol succinate (TOPROL-XL) 25 MG 24 hr tablet Take 1 tablet (25 mg total) by mouth daily. 30 tablet 3  . potassium chloride SA (K-DUR,KLOR-CON) 20 MEQ tablet Take 1 tablet (20 mEq total) by mouth daily. 90 tablet 3  . rosuvastatin (CRESTOR) 10 MG tablet Take 10 mg by mouth every evening.  5  . amLODipine (NORVASC) 5 MG tablet Take 1 tablet (5 mg total) by mouth daily. 90 tablet 3  No facility-administered medications prior to visit.      Allergies:   Lisinopril and Penicillins   Social History   Socioeconomic History  . Marital status: Married    Spouse name: None  . Number of children: 2  . Years of education: None  . Highest education level: None  Social Needs  . Financial resource strain: None  . Food insecurity - worry: None  . Food insecurity - inability: None  . Transportation needs - medical: None  . Transportation needs - non-medical: None  Occupational History  . None  Tobacco Use  . Smoking status: Never Smoker  . Smokeless tobacco: Never Used  Substance and Sexual Activity  . Alcohol use: No  . Drug use: No  . Sexual activity:  None  Other Topics Concern  . None  Social History Narrative  . None     Family History:  The patient's family history includes Aneurysm (age of onset: 28) in his father; Cancer in his mother; Diabetes in his mother; Heart disease (age of onset: 17) in his mother.   ROS:   Please see the history of present illness.    ROS All other systems reviewed and are negative.   PHYSICAL EXAM:   VS:  BP 132/88   Pulse 65   Ht 6' (1.829 m)   Wt 270 lb (122.5 kg)   BMI 36.62 kg/m    GEN: Well nourished, well developed, in no acute distress  HEENT: normal  Neck: no JVD, carotid bruits, or masses Cardiac: RRR; no murmurs, rubs, or gallops. 2+ pitting edema  Respiratory:  clear to auscultation bilaterally, normal work of breathing GI: soft, nontender, nondistended, + BS MS: no deformity or atrophy  Skin: warm and dry, no rash Neuro:  Alert and Oriented x 3, Strength and sensation are intact Psych: euthymic mood, full affect  Wt Readings from Last 3 Encounters:  09/16/17 270 lb (122.5 kg)  07/10/17 263 lb 3.2 oz (119.4 kg)  06/17/17 263 lb (119.3 kg)      Studies/Labs Reviewed:   EKG:  EKG is not ordered today.    Recent Labs: 02/24/2017: Hemoglobin 12.7; Platelets 208 06/17/2017: NT-Pro BNP 59; TSH 4.380 08/28/2017: BUN 19; Creatinine, Ser 0.75; Potassium 3.5; Sodium 140   Lipid Panel    Component Value Date/Time   CHOL 171 07/01/2016 0839   TRIG 387 (H) 07/01/2016 0839   HDL 27 (L) 07/01/2016 0839   CHOLHDL 6.3 (H) 07/01/2016 0839   LDLCALC 67 07/01/2016 0839    Additional studies/ records that were reviewed today include:   Echo 07/04/2017 LV EF: 55% -   60%  Study Conclusions  - Left ventricle: The cavity size was mildly dilated. Wall   thickness was normal. Systolic function was normal. The estimated   ejection fraction was in the range of 55% to 60%. The study is   not technically sufficient to allow evaluation of LV diastolic   function.    ASSESSMENT:      1. Chest pain, unspecified type   2. Essential hypertension   3. Therapeutic drug monitoring   4. Lower extremity edema   5. Hyperlipidemia, unspecified hyperlipidemia type   6. Hypothyroidism, unspecified type      PLAN:  In order of problems listed above:  1. Chest pain: This is the same chest pain he had when he saw [facility], it mainly occurs at rest.  I would recommend to proceed with a coronary CT that was ordered by  Luke.  2. Lower extremity edema: I will increase his Lasix to 40 mg twice daily and potassium supplement 20 mEq twice daily.  He will need 1 week basic metabolic panel  3. Hypertension: Blood pressure stay elevated, will increase Toprol-XL to 50 mg daily  4. Hyperlipidemia: On Crestor 10 mg daily, will defer annual lipid panel to primary care provider  5. Hypothyroidism: On Synthroid managed by primary care provider    Medication Adjustments/Labs and Tests Ordered: Current medicines are reviewed at length with the patient today.  Concerns regarding medicines are outlined above.  Medication changes, Labs and Tests ordered today are listed in the Patient Instructions below. Patient Instructions  Medication Instructions:  STOP AMLODIPINE   INCREASE YOUR FUROSEMIDE TO 40 MG TWICE A DAY   INCREASE YOUR POTASSIUM TO 20 MEQ TWICE A DAY  INCREASE YOUR METOPROLOL SUC TO 50 MG DAILY   Labwork: BMET IN 1 WEEK   Testing/Procedures: Your physician has requested that you have cardiac CT. Cardiac computed tomography (CT) is a painless test that uses an x-ray machine to take clear, detailed pictures of your heart. For further information please visit [email or web address] Please follow instruction sheet as given.  Follow-Up: Your physician recommends that you schedule a follow-up appointment in: 2-3 MONTHS WITH DR Jens Som   If you need a refill on your cardiac medications before your next appointment, please call your pharmacy.     Ramond Dial, [place]   09/18/2017 1:35 PM    [facility] [address] [place], [place], [place]  [address] Phone: [phone]; Fax: [phone]

## 2017-10-30 ENCOUNTER — Other Ambulatory Visit: Payer: Self-pay | Admitting: *Deleted

## 2017-11-21 ENCOUNTER — Encounter: Payer: Self-pay | Admitting: Family Medicine

## 2017-11-21 ENCOUNTER — Ambulatory Visit: Payer: Commercial Managed Care - PPO | Admitting: Family Medicine

## 2017-11-21 VITALS — BP 133/85 | HR 62 | Temp 97.6°F | Ht 72.0 in | Wt 267.1 lb

## 2017-11-21 DIAGNOSIS — H6502 Acute serous otitis media, left ear: Secondary | ICD-10-CM | POA: Diagnosis not present

## 2017-11-21 DIAGNOSIS — H6123 Impacted cerumen, bilateral: Secondary | ICD-10-CM

## 2017-11-21 MED ORDER — SULFAMETHOXAZOLE-TRIMETHOPRIM 800-160 MG PO TABS: 1.0000 | ORAL_TABLET | Freq: Two times a day (BID) | ORAL | 0 refills | Status: DC

## 2017-11-21 NOTE — Progress Notes (Signed)
Chief Complaint  Patient presents with  . Ear Problem    pt here today c/o left ear being stopped up    HPI  Patient presents today for some discomfort in the left ear.  He says it has been plugged with wax in the past.  PMH: Smoking status noted ROS: Per HPI  Objective: BP 133/85   Pulse 62   Temp 97.6 F (36.4 C) (Oral)   Ht 6' (1.829 m)   Wt 267 lb 2 oz (121.2 kg)   BMI 36.23 kg/m  Gen: NAD, alert, cooperative with exam HEENT: NCAT, EOMI, PERRL Both ears have a great deal of cerumen noted.  On the left however there is moderate cerumen with some erythema noted at the tympanogram.  Assessment and plan:  1. Bilateral impacted cerumen   2. Acute serous otitis media of left ear, recurrence not specified     Meds ordered this encounter  Medications  . sulfamethoxazole-trimethoprim (BACTRIM DS) 800-160 MG tablet    Sig: Take 1 tablet by mouth 2 (two) times daily.    Dispense:  20 tablet    Refill:  0    Patient was advised to finish the antibiotic and use Debrox drops daily at home for 1 week.  Follow-up here for lavage once the infection cleared should there is still be an impaction and decreased hearing or pressure noted.  Follow up as needed.  Mechele Claude, MD

## 2017-11-21 NOTE — Patient Instructions (Signed)
Use Debrox drops daily for one week. Should clear the wax from your ear.

## 2017-11-25 ENCOUNTER — Telehealth: Payer: Self-pay | Admitting: Family

## 2017-11-25 NOTE — Telephone Encounter (Signed)
Please advise 

## 2017-11-26 ENCOUNTER — Other Ambulatory Visit: Payer: Self-pay | Admitting: Family

## 2017-11-26 NOTE — Telephone Encounter (Signed)
Patient requesting new medication for ear pain. Current medication has not helped.

## 2017-11-27 MED ORDER — OFLOXACIN 0.3 % OT SOLN: 5.0000 [drp] | Freq: Every day | OTIC | 0 refills | Status: DC

## 2017-11-27 MED ORDER — AZITHROMYCIN 250 MG PO TABS: ORAL_TABLET | ORAL | 0 refills | Status: DC

## 2017-11-27 NOTE — Telephone Encounter (Signed)
Patient aware, per message left on voice mail,   script is ready. 

## 2017-11-27 NOTE — Telephone Encounter (Signed)
Stop bactrim and start Zpak and sent Ofloxacin ear drops.

## 2017-12-03 ENCOUNTER — Other Ambulatory Visit: Payer: Self-pay | Admitting: Family

## 2017-12-03 DIAGNOSIS — K219 Gastro-esophageal reflux disease without esophagitis: Secondary | ICD-10-CM

## 2017-12-31 NOTE — Progress Notes (Signed)
HPI: FU hypertension. ETT 7/17 normal.   Echocardiogram December 2018 showed normal LV function.  Lasix added at previous office visit for lower extremity edema.  CTA ordered for chest pain in December 2018 but not performed.  It was again recommended in March but not performed.  Since last seen he has some dyspnea on exertion.  With vigorous activities he has some discomfort in his upper chest relieved with rest.  This is unchanged.  He continues with pedal edema.  No syncope.  Current Outpatient Medications  Medication Sig Dispense Refill  . azithromycin (ZITHROMAX) 250 MG tablet Take 500 mg once, then 250 mg for four days 6 tablet 0  . furosemide (LASIX) 40 MG tablet Take 1 tablet (40 mg total) by mouth 2 (two) times daily. 90 tablet 1  . levothyroxine (SYNTHROID, LEVOTHROID) 25 MCG tablet TAKE 1 TABLET (25 MCG TOTAL) BY MOUTH DAILY BEFORE BREAKFAST. 90 tablet 1  . metoprolol succinate (TOPROL-XL) 50 MG 24 hr tablet Take 1 tablet (50 mg total) by mouth daily. 90 tablet 1  . pantoprazole (PROTONIX) 20 MG tablet TAKE 1 TABLET BY MOUTH EVERY DAY 90 tablet 1  . potassium chloride SA (K-DUR,KLOR-CON) 20 MEQ tablet Take 1 tablet (20 mEq total) by mouth 2 (two) times daily. 180 tablet 1  . rosuvastatin (CRESTOR) 10 MG tablet Take 1 tablet (10 mg total) by mouth every evening. 90 tablet 1  . Vitamin D, Ergocalciferol, (DRISDOL) 50000 units CAPS capsule TAKE 1 CAPSULE (50,000 UNITS TOTAL) BY MOUTH EVERY 7 (SEVEN) DAYS. 12 capsule 0   No current facility-administered medications for this visit.      Past Medical History:  Diagnosis Date  . GERD (gastroesophageal reflux disease)   . Hyperlipidemia   . Hypertension   . Thyroid disease     Past Surgical History:  Procedure Laterality Date  . No prior surgery      Social History   Socioeconomic History  . Marital status: Married    Spouse name: Not on file  . Number of children: 2  . Years of education: Not on file  . Highest  education level: Not on file  Occupational History  . Not on file  Social Needs  . Financial resource strain: Not on file  . Food insecurity:    Worry: Not on file    Inability: Not on file  . Transportation needs:    Medical: Not on file    Non-medical: Not on file  Tobacco Use  . Smoking status: Never Smoker  . Smokeless tobacco: Never Used  Substance and Sexual Activity  . Alcohol use: No  . Drug use: No  . Sexual activity: Not on file  Lifestyle  . Physical activity:    Days per week: Not on file    Minutes per session: Not on file  . Stress: Not on file  Relationships  . Social connections:    Talks on phone: Not on file    Gets together: Not on file    Attends religious service: Not on file    Active member of club or organization: Not on file    Attends meetings of clubs or organizations: Not on file    Relationship status: Not on file  . Intimate partner violence:    Fear of current or ex partner: Not on file    Emotionally abused: Not on file    Physically abused: Not on file    Forced sexual activity: Not on  file  Other Topics Concern  . Not on file  Social History Narrative  . Not on file    Family History  Problem Relation Age of Onset  . Heart disease Mother 6550  . Diabetes Mother   . Cancer Mother   . Aneurysm Father 40    ROS: no fevers or chills, productive cough, hemoptysis, dysphasia, odynophagia, melena, hematochezia, dysuria, hematuria, rash, seizure activity, orthopnea, PND, claudication. Remaining systems are negative.  Physical Exam: Well-developed well-nourished in no acute distress.  Skin is warm and dry.  HEENT is normal.  Neck is supple.  Chest is clear to auscultation with normal expansion.  Cardiovascular exam is regular rate and rhythm.  Abdominal exam nontender or distended. No masses palpated. Extremities show 1+ edema. neuro grossly intact   A/P  1 hypertension-blood pressure elevated today.  Plan to continue diuretic  and metoprolol.  He had angioedema with lisinopril in the past.  I will add amlodipine 5 mg daily and follow blood pressure.  Increase as needed.  2 chest pain-previous atypical chest pain.  CTA ordered but not performed.  He would be interested but is concerned about cost.  We will review and schedule if agreeable.  3 hyperlipidemia-continue statin.  Followed by primary care.  4 edema-plan to continue Lasix.  Check potassium and renal function.  Olga MillersBrian Jeanine Caven, MD

## 2018-01-01 ENCOUNTER — Ambulatory Visit: Payer: Commercial Managed Care - PPO | Admitting: Cardiology

## 2018-01-01 ENCOUNTER — Encounter: Payer: Self-pay | Admitting: Cardiology

## 2018-01-01 VITALS — BP 162/90 | HR 69 | Ht 72.0 in | Wt 270.0 lb

## 2018-01-01 DIAGNOSIS — E7800 Pure hypercholesterolemia, unspecified: Secondary | ICD-10-CM

## 2018-01-01 DIAGNOSIS — I10 Essential (primary) hypertension: Secondary | ICD-10-CM | POA: Diagnosis not present

## 2018-01-01 DIAGNOSIS — R600 Localized edema: Secondary | ICD-10-CM | POA: Diagnosis not present

## 2018-01-01 DIAGNOSIS — R072 Precordial pain: Secondary | ICD-10-CM

## 2018-01-01 MED ORDER — AMLODIPINE BESYLATE 5 MG PO TABS: 5.0000 mg | ORAL_TABLET | Freq: Every day | ORAL | 3 refills | Status: DC

## 2018-01-01 NOTE — Patient Instructions (Signed)
Medication Instructions:   START AMLODIPINE 5 MG ONCE DAILY  Labwork:  Your physician recommends that you HAVE LAB WORK TODAY  Testing/Procedures:  Your physician has requested that you have cardiac CT. Cardiac computed tomography (CT) is a painless test that uses an x-ray machine to take clear, detailed pictures of your heart. For further information please visit [email or web address]. Please follow instruction sheet as given.  Follow-Up:  Your physician recommends that you schedule a follow-up appointment in: 6 MONTHS WITH DR Jens SomRENSHAW   If you need a refill on your cardiac medications before your next appointment, please call your pharmacy.

## 2018-01-02 LAB — BASIC METABOLIC PANEL
BUN / CREAT RATIO: 14 (ref 9–20)
BUN: 15 mg/dL (ref 6–24)
CO2: 23 mmol/L (ref 20–29)
Calcium: 9.2 mg/dL (ref 8.7–10.2)
Chloride: 103 mmol/L (ref 96–106)
Creatinine, Ser: 1.09 mg/dL (ref 0.76–1.27)
GFR, EST AFRICAN AMERICAN: 95 mL/min/{1.73_m2} (ref >59)
GFR, EST NON AFRICAN AMERICAN: 82 mL/min/{1.73_m2} (ref >59)
Glucose: 89 mg/dL (ref 65–99)
Potassium: 3.7 mmol/L (ref 3.5–5.2)
SODIUM: 143 mmol/L (ref 134–144)

## 2018-01-05 ENCOUNTER — Encounter: Payer: Self-pay | Admitting: *Deleted

## 2018-02-09 ENCOUNTER — Telehealth: Payer: Self-pay | Admitting: Family

## 2018-02-09 NOTE — Telephone Encounter (Signed)
appt scheduled Pt notified 

## 2018-02-10 ENCOUNTER — Ambulatory Visit (INDEPENDENT_AMBULATORY_CARE_PROVIDER_SITE_OTHER): Payer: Commercial Managed Care - PPO | Admitting: Family

## 2018-02-10 ENCOUNTER — Encounter: Payer: Self-pay | Admitting: Family

## 2018-02-10 VITALS — BP 138/93 | HR 64 | Temp 97.9°F | Ht 72.0 in | Wt 266.4 lb

## 2018-02-10 DIAGNOSIS — H6123 Impacted cerumen, bilateral: Secondary | ICD-10-CM

## 2018-02-10 NOTE — Patient Instructions (Signed)
Earwax Buildup, Adult The ears produce a substance called earwax that helps keep bacteria out of the ear and protects the skin in the ear canal. Occasionally, earwax can build up in the ear and cause discomfort or hearing loss. What increases the risk? This condition is more likely to develop in people who:  Are male.  Are elderly.  Naturally produce more earwax.  Clean their ears often with cotton swabs.  Use earplugs often.  Use in-ear headphones often.  Wear hearing aids.  Have narrow ear canals.  Have earwax that is overly thick or sticky.  Have eczema.  Are dehydrated.  Have excess hair in the ear canal.  What are the signs or symptoms? Symptoms of this condition include:  Reduced or muffled hearing.  A feeling of fullness in the ear or feeling that the ear is plugged.  Fluid coming from the ear.  Ear pain.  Ear itch.  Ringing in the ear.  Coughing.  An obvious piece of earwax that can be seen inside the ear canal.  How is this diagnosed? This condition may be diagnosed based on:  Your symptoms.  Your medical history.  An ear exam. During the exam, your health care provider will look into your ear with an instrument called an otoscope.  You may have tests, including a hearing test. How is this treated? This condition may be treated by:  Using ear drops to soften the earwax.  Having the earwax removed by a health care provider. The health care provider may: ? Flush the ear with water. ? Use an instrument that has a loop on the end (curette). ? Use a suction device.  Surgery to remove the wax buildup. This may be done in severe cases.  Follow these instructions at home:  Take over-the-counter and prescription medicines only as told by your health care provider.  Do not put any objects, including cotton swabs, into your ear. You can clean the opening of your ear canal with a washcloth or facial tissue.  Follow instructions from your health  care provider about cleaning your ears. Do not over-clean your ears.  Drink enough fluid to keep your urine clear or pale yellow. This will help to thin the earwax.  Keep all follow-up visits as told by your health care provider. If earwax builds up in your ears often or if you use hearing aids, consider seeing your health care provider for routine, preventive ear cleanings. Ask your health care provider how often you should schedule your cleanings.  If you have hearing aids, clean them according to instructions from the manufacturer and your health care provider. Contact a health care provider if:  You have ear pain.  You develop a fever.  You have blood, pus, or other fluid coming from your ear.  You have hearing loss.  You have ringing in your ears that does not go away.  Your symptoms do not improve with treatment.  You feel like the room is spinning (vertigo). Summary  Earwax can build up in the ear and cause discomfort or hearing loss.  The most common symptoms of this condition include reduced or muffled hearing and a feeling of fullness in the ear or feeling that the ear is plugged.  This condition may be diagnosed based on your symptoms, your medical history, and an ear exam.  This condition may be treated by using ear drops to soften the earwax or by having the earwax removed by a health care provider.  Do   not put any objects, including cotton swabs, into your ear. You can clean the opening of your ear canal with a washcloth or facial tissue. This information is not intended to replace advice given to you by your health care provider. Make sure you discuss any questions you have with your health care provider. Document Released: 08/08/2004 Document Revised: 09/11/2016 Document Reviewed: 09/11/2016 Elsevier Interactive Patient Education  2018 Elsevier Inc.  

## 2018-02-10 NOTE — Progress Notes (Signed)
   Subjective:    Patient ID: Steve Conner, male    DOB: 08-04-1970, 47 y.o.   MRN: [id number]  Chief Complaint  Patient presents with  . decreased hearing in left ear    Ear Fullness   There is pain in the left ear. This is a new problem. The current episode started 1 to 4 weeks ago. The problem occurs constantly. The problem has been gradually worsening. There has been no fever. The patient is experiencing no pain. Associated symptoms include hearing loss. Pertinent negatives include no diarrhea, headaches, rhinorrhea or sore throat. He has tried ear drops for the symptoms. The treatment provided no relief.      Review of Systems  HENT: Positive for hearing loss. Negative for rhinorrhea and sore throat.   Gastrointestinal: Negative for diarrhea.  Neurological: Negative for headaches.  All other systems reviewed and are negative.      Objective:   Physical Exam  Constitutional: He is oriented to person, place, and time. He appears well-developed and well-nourished. No distress.  HENT:  Head: Normocephalic.  Mouth/Throat: Oropharynx is clear and moist.  Bilateral ear cerumen impaction   Eyes: Pupils are equal, round, and reactive to light. Right eye exhibits no discharge. Left eye exhibits no discharge.  Neck: Normal range of motion. Neck supple. No thyromegaly present.  Cardiovascular: Normal rate, regular rhythm, normal heart sounds and intact distal pulses.  No murmur heard. Pulmonary/Chest: Effort normal and breath sounds normal. No respiratory distress. He has no wheezes.  Abdominal: Soft. Bowel sounds are normal. He exhibits no distension. There is no tenderness.  Musculoskeletal: Normal range of motion. He exhibits no edema or tenderness.  Neurological: He is alert and oriented to person, place, and time. He has normal reflexes. No cranial nerve deficit.  Skin: Skin is warm and dry. No rash noted. No erythema.  Psychiatric: He has a normal mood and affect. His behavior is  normal. Judgment and thought content normal.  Vitals reviewed.  Bilateral ears cleaned with warm water and peroxide, TM WNL   BP (!) 138/93   Pulse 64   Temp 97.9 F (36.6 C) (Oral)   Ht 6' (1.829 m)   Wt 266 lb 6.4 oz (120.8 kg)   BMI 36.13 kg/m      Assessment & Plan:  Steve Conner was seen today for decreased hearing in left ear.  Diagnoses and all orders for this visit:  Bilateral impacted cerumen   Do not stick anything into ears OTC ear drops as needed RTO if symptoms worsen or do not improve  Jannifer Rodneyhristy Emoree Sasaki, [name]

## 2018-04-07 ENCOUNTER — Other Ambulatory Visit: Payer: Self-pay | Admitting: Physician Assistant

## 2018-07-04 NOTE — Progress Notes (Signed)
HPI: FU hypertension. ETT 7/17 normal.  Echocardiogram December 2018 showed normal LV function.  Lasix added at previous office visit for lower extremity edema.  CTA for CP previously ordered but not performed as pt concerned about expense. Since last seen patient continues to have chest pain.  It is in the right and left chest area.  It is described as a burning sensation without radiation or associated symptoms.  Occurs both with exertion and at rest.  Can last several hours at a time.  Resolves spontaneously.  No dyspnea, palpitations or syncope.  Current Outpatient Medications  Medication Sig Dispense Refill  . amLODipine (NORVASC) 5 MG tablet Take 1 tablet (5 mg total) by mouth daily. 90 tablet 3  . azithromycin (ZITHROMAX) 250 MG tablet Take 500 mg once, then 250 mg for four days 6 tablet 0  . furosemide (LASIX) 40 MG tablet Take 1 tablet (40 mg total) by mouth 2 (two) times daily. 90 tablet 1  . levothyroxine (SYNTHROID, LEVOTHROID) 25 MCG tablet TAKE 1 TABLET (25 MCG TOTAL) BY MOUTH DAILY BEFORE BREAKFAST. 90 tablet 1  . metoprolol succinate (TOPROL-XL) 50 MG 24 hr tablet TAKE 1 TABLET BY MOUTH EVERY DAY 30 tablet 5  . pantoprazole (PROTONIX) 20 MG tablet TAKE 1 TABLET BY MOUTH EVERY DAY 90 tablet 1  . potassium chloride SA (K-DUR,KLOR-CON) 20 MEQ tablet Take 1 tablet (20 mEq total) by mouth 2 (two) times daily. 180 tablet 1  . rosuvastatin (CRESTOR) 10 MG tablet Take 1 tablet (10 mg total) by mouth every evening. 90 tablet 1  . Vitamin D, Ergocalciferol, (DRISDOL) 50000 units CAPS capsule TAKE 1 CAPSULE (50,000 UNITS TOTAL) BY MOUTH EVERY 7 (SEVEN) DAYS. 12 capsule 0   No current facility-administered medications for this visit.      Past Medical History:  Diagnosis Date  . GERD (gastroesophageal reflux disease)   . Hyperlipidemia   . Hypertension   . Thyroid disease     Past Surgical History:  Procedure Laterality Date  . No prior surgery      Social History    Socioeconomic History  . Marital status: Married    Spouse name: Not on file  . Number of children: 2  . Years of education: Not on file  . Highest education level: Not on file  Occupational History  . Not on file  Social Needs  . Financial resource strain: Not on file  . Food insecurity:    Worry: Not on file    Inability: Not on file  . Transportation needs:    Medical: Not on file    Non-medical: Not on file  Tobacco Use  . Smoking status: Never Smoker  . Smokeless tobacco: Never Used  Substance and Sexual Activity  . Alcohol use: No  . Drug use: No  . Sexual activity: Not on file  Lifestyle  . Physical activity:    Days per week: Not on file    Minutes per session: Not on file  . Stress: Not on file  Relationships  . Social connections:    Talks on phone: Not on file    Gets together: Not on file    Attends religious service: Not on file    Active member of club or organization: Not on file    Attends meetings of clubs or organizations: Not on file    Relationship status: Not on file  . Intimate partner violence:    Fear of current or ex partner:  Not on file    Emotionally abused: Not on file    Physically abused: Not on file    Forced sexual activity: Not on file  Other Topics Concern  . Not on file  Social History Narrative  . Not on file    Family History  Problem Relation Age of Onset  . Heart disease Mother 4850  . Diabetes Mother   . Cancer Mother   . Aneurysm Father 40    ROS: no fevers or chills, productive cough, hemoptysis, dysphasia, odynophagia, melena, hematochezia, dysuria, hematuria, rash, seizure activity, orthopnea, PND, pedal edema, claudication. Remaining systems are negative.  Physical Exam: Well-developed well-nourished in no acute distress.  Skin is warm and dry.  HEENT is normal.  Neck is supple.  Chest is clear to auscultation with normal expansion.  Cardiovascular exam is regular rate and rhythm.  Abdominal exam nontender  or distended. No masses palpated. Extremities show no edema. neuro grossly intact  ECG-sinus rhythm at a rate of 60.  No ST changes.  Personally reviewed  A/P  1 chest pain-symptoms with both typical and atypical features.  Previous ETT negative but persistent symptoms.  Will arrange CTA to more fully assess.   2 hypertension-patient's blood pressure is elevated.  Increase amlodipine to 10 mg daily and follow.  Check potassium and renal function.  Note patient had angioedema with ACE inhibitor previously.  3 hyperlipidemia-continue statin.  Check lipids and liver.  4 lower extremity edema-continue present dose of diuretic.  Check potassium and renal function.  Olga MillersBrian Mecca Guitron, MD

## 2018-07-06 ENCOUNTER — Other Ambulatory Visit: Payer: Self-pay | Admitting: Family Medicine

## 2018-07-06 ENCOUNTER — Other Ambulatory Visit: Payer: Self-pay | Admitting: Family

## 2018-07-06 DIAGNOSIS — K219 Gastro-esophageal reflux disease without esophagitis: Secondary | ICD-10-CM

## 2018-07-14 ENCOUNTER — Ambulatory Visit (INDEPENDENT_AMBULATORY_CARE_PROVIDER_SITE_OTHER): Payer: Commercial Managed Care - PPO | Admitting: Cardiology

## 2018-07-14 ENCOUNTER — Encounter (INDEPENDENT_AMBULATORY_CARE_PROVIDER_SITE_OTHER): Payer: Self-pay

## 2018-07-14 ENCOUNTER — Encounter: Payer: Self-pay | Admitting: Cardiology

## 2018-07-14 VITALS — BP 138/86 | HR 60 | Ht 72.0 in | Wt 269.0 lb

## 2018-07-14 DIAGNOSIS — R072 Precordial pain: Secondary | ICD-10-CM | POA: Diagnosis not present

## 2018-07-14 DIAGNOSIS — I10 Essential (primary) hypertension: Secondary | ICD-10-CM

## 2018-07-14 DIAGNOSIS — E7800 Pure hypercholesterolemia, unspecified: Secondary | ICD-10-CM

## 2018-07-14 MED ORDER — METOPROLOL TARTRATE 50 MG PO TABS: ORAL_TABLET | ORAL | 0 refills | Status: DC

## 2018-07-14 MED ORDER — AMLODIPINE BESYLATE 10 MG PO TABS: 10.0000 mg | ORAL_TABLET | Freq: Every day | ORAL | 3 refills | Status: DC

## 2018-07-14 NOTE — Patient Instructions (Signed)
Medication Instructions:  INCREASE AMLODIPINE TO 10 MG ONCE DAILY= 2 OF THE 5 MG TABLETS ONCE DAILY If you need a refill on your cardiac medications before your next appointment, please call your pharmacy.   Lab work: Your physician recommends that you return for lab work PRIOR TO EATING If you have labs (blood work) drawn today and your tests are completely normal, you will receive your results only by: Marland Kitchen. MyChart Message (if you have MyChart) OR . A paper copy in the mail If you have any lab test that is abnormal or we need to change your treatment, we will call you to review the results.  Testing/Procedures: CARDIAC CTA AT [facility] [place] Please [facility] the [facility] at xx:xx AM (30-45 minutes prior to test start time)  [facility] [address] [place], [place] [address] [phone]  Proceed to the [facility] Radiology Department (First Floor).  Please follow these instructions carefully (unless otherwise directed):  Hold all erectile dysfunction medications at least 48 hours prior to test.  On the Night Before the Test: . Be sure to Drink plenty of water. . Do not consume any caffeinated/decaffeinated beverages or chocolate 12 hours prior to your test. . Do not take any antihistamines 12 hours prior to your test. .   On the Day of the Test: . Drink plenty of water. Do not drink any water within one hour of the test. . Do not eat any food 4 hours prior to the test. . You may take your regular medications prior to the test.  . Take metoprolol (Lopressor) two hours prior to test. . HOLD Furosemide/Hydrochlorothiazide morning of the test.   *For Clinical Staff only. Please instruct patient the following:*        -Drink plenty of water       -Hold Furosemide/hydrochlorothiazide morning of the test       -Take metoprolol (Lopressor) 2 hours prior to test (if applicable).    TAKE LOPRESSOR 50 MG 2 HOURS PRIOR TO  SCAN       After the Test: . Drink plenty of water. . After receiving IV contrast, you may experience a mild flushed feeling. This is normal. . On occasion, you may experience a mild rash up to 24 hours after the test. This is not dangerous. If this occurs, you can take Benadryl 25 mg and increase your fluid intake. . If you experience trouble breathing, this can be serious. If it is severe call 911 IMMEDIATELY. If it is mild, please call our office. . If you take any of these medications: Glipizide/Metformin, Avandament, Glucavance, please do not take 48 hours after completing test.    Follow-Up: At [facility], you and your health needs are our priority.  As part of our continuing mission to provide you with exceptional heart care, we have created designated Provider Care Teams.  These Care Teams include your primary Cardiologist (physician) and Advanced Practice Providers (APPs -  Physician Assistants and Nurse Practitioners) who all work together to provide you with the care you need, when you need it. You will need a follow up appointment in 12 months.  Please call our office 2 months in advance to schedule this appointment.  You may see Olga MillersBRIAN CRENSHAW MD or one of the following Advanced Practice Providers on your designated Care Team:   Corine ShelterLuke Kilroy, PA-C Judy PimpleKrista Kroeger, [place]PA-C . Marjie Skiffallie Goodrich, PA-C  CALL IN September FOR APPOINTMENT [place]

## 2018-08-10 ENCOUNTER — Other Ambulatory Visit: Payer: Self-pay | Admitting: Family

## 2018-08-10 ENCOUNTER — Other Ambulatory Visit: Payer: Self-pay | Admitting: Family Medicine

## 2018-08-10 DIAGNOSIS — K219 Gastro-esophageal reflux disease without esophagitis: Secondary | ICD-10-CM

## 2018-08-10 NOTE — Telephone Encounter (Signed)
Patient aware.

## 2018-08-10 NOTE — Telephone Encounter (Signed)
[place]. NTBS 30 days given 07/06/18

## 2018-08-11 NOTE — Telephone Encounter (Signed)
Last thyroid 06/17/17

## 2018-09-02 ENCOUNTER — Ambulatory Visit (HOSPITAL_COMMUNITY): Payer: Commercial Managed Care - PPO

## 2018-09-16 ENCOUNTER — Other Ambulatory Visit: Payer: Self-pay | Admitting: Family

## 2018-09-16 NOTE — Telephone Encounter (Signed)
Last thyroid test 12/18

## 2018-10-10 ENCOUNTER — Other Ambulatory Visit: Payer: Self-pay | Admitting: Family

## 2018-10-16 ENCOUNTER — Telehealth: Payer: Self-pay | Admitting: Family

## 2018-10-16 NOTE — Telephone Encounter (Signed)
What is the name of the medication? levothyroxine (SYNTHROID, LEVOTHROID) 25 MCG tablet, furosemide (LASIX) 40 MG tablet  Have you contacted your pharmacy to request a refill? YES  Which pharmacy would you like this sent to? [facility]    Patient notified that their request is being sent to the clinical staff for review and that they should receive a call once it is complete. If they do not receive a call within 24 hours they can check with their pharmacy or our office.

## 2018-10-16 NOTE — Telephone Encounter (Signed)
meds denied - needs appt - no thyroid labs since 06/2017.  Needs OV and labs.  appt made for MON 10/19/18.

## 2018-10-19 ENCOUNTER — Ambulatory Visit (INDEPENDENT_AMBULATORY_CARE_PROVIDER_SITE_OTHER): Payer: Commercial Managed Care - PPO | Admitting: Family

## 2018-10-19 ENCOUNTER — Encounter: Payer: Self-pay | Admitting: Family

## 2018-10-19 ENCOUNTER — Other Ambulatory Visit: Payer: Self-pay

## 2018-10-19 VITALS — BP 142/91 | HR 63 | Temp 98.6°F | Ht 72.0 in | Wt 261.0 lb

## 2018-10-19 DIAGNOSIS — E039 Hypothyroidism, unspecified: Secondary | ICD-10-CM | POA: Diagnosis not present

## 2018-10-19 DIAGNOSIS — H6122 Impacted cerumen, left ear: Secondary | ICD-10-CM

## 2018-10-19 DIAGNOSIS — R609 Edema, unspecified: Secondary | ICD-10-CM

## 2018-10-19 DIAGNOSIS — E781 Pure hyperglyceridemia: Secondary | ICD-10-CM | POA: Diagnosis not present

## 2018-10-19 DIAGNOSIS — E559 Vitamin D deficiency, unspecified: Secondary | ICD-10-CM

## 2018-10-19 DIAGNOSIS — K219 Gastro-esophageal reflux disease without esophagitis: Secondary | ICD-10-CM | POA: Diagnosis not present

## 2018-10-19 DIAGNOSIS — I10 Essential (primary) hypertension: Secondary | ICD-10-CM

## 2018-10-19 DIAGNOSIS — E8881 Metabolic syndrome: Secondary | ICD-10-CM

## 2018-10-19 MED ORDER — FUROSEMIDE 40 MG PO TABS: 40.0000 mg | ORAL_TABLET | Freq: Two times a day (BID) | ORAL | 1 refills | Status: DC

## 2018-10-19 MED ORDER — LEVOTHYROXINE SODIUM 25 MCG PO TABS: 25.0000 ug | ORAL_TABLET | Freq: Every day | ORAL | 1 refills | Status: DC

## 2018-10-19 MED ORDER — AMLODIPINE BESYLATE 10 MG PO TABS: 10.0000 mg | ORAL_TABLET | Freq: Every day | ORAL | 3 refills | Status: DC

## 2018-10-19 MED ORDER — PANTOPRAZOLE SODIUM 20 MG PO TBEC: 20.0000 mg | DELAYED_RELEASE_TABLET | Freq: Every day | ORAL | 1 refills | Status: DC

## 2018-10-19 MED ORDER — ROSUVASTATIN CALCIUM 10 MG PO TABS: 10.0000 mg | ORAL_TABLET | Freq: Every evening | ORAL | 1 refills | Status: DC

## 2018-10-19 MED ORDER — METOPROLOL SUCCINATE ER 50 MG PO TB24: 50.0000 mg | ORAL_TABLET | Freq: Every day | ORAL | 2 refills | Status: DC

## 2018-10-19 NOTE — Progress Notes (Signed)
Subjective:    Patient ID: Steve Conner, male    DOB: 01-31-1971, 48 y.o.   MRN: [id number]  Chief Complaint  Patient presents with  . Medical Management of Chronic Issues   PT presents to the office today for chronic follow up. He is followed by Cardiologists annually for SOB & family hx of CAD.   He states he has not taken his BP medication this morning because he forgot. BP is slightly elevated today.  Hypertension  This is a chronic problem. The current episode started more than 1 year ago. The problem has been waxing and waning since onset. The problem is uncontrolled. Associated symptoms include peripheral edema ("at times"). Pertinent negatives include no chest pain, headaches, malaise/fatigue or shortness of breath. Risk factors for coronary artery disease include dyslipidemia, obesity, male gender and sedentary lifestyle. The current treatment provides moderate improvement. There is no history of CAD/MI, CVA or heart failure. Identifiable causes of hypertension include a thyroid problem.  Hyperlipidemia  This is a chronic problem. The current episode started more than 1 year ago. The problem is uncontrolled. Recent lipid tests were reviewed and are high. Exacerbating diseases include obesity. Pertinent negatives include no chest pain or shortness of breath. Current antihyperlipidemic treatment includes statins. The current treatment provides moderate improvement of lipids. Risk factors for coronary artery disease include dyslipidemia, diabetes mellitus, male sex, hypertension and a sedentary lifestyle.  Thyroid Problem  Presents for follow-up visit. Symptoms include fatigue. Patient reports no depressed mood, diarrhea, hoarse voice or visual change. The symptoms have been stable. His past medical history is significant for hyperlipidemia. There is no history of heart failure.  Gastroesophageal Reflux  He reports no chest pain, no coughing, no heartburn or no hoarse voice. This is a  chronic problem. The current episode started more than 1 year ago. The problem occurs occasionally. The problem has been waxing and waning. The symptoms are aggravated by certain foods. Associated symptoms include fatigue. He has tried a PPI for the symptoms. The treatment provided moderate relief.  Metabolic Syndrome States he tries to eat healthy and is active. He takes is Crestor daily.    Review of Systems  Constitutional: Positive for fatigue. Negative for malaise/fatigue.  HENT: Negative for hoarse voice.   Respiratory: Negative for cough and shortness of breath.   Cardiovascular: Negative for chest pain.  Gastrointestinal: Negative for diarrhea and heartburn.  Neurological: Negative for headaches.  All other systems reviewed and are negative.      Objective:   Physical Exam Vitals signs reviewed.  Constitutional:      General: He is not in acute distress.    Appearance: He is well-developed.  HENT:     Head: Normocephalic.     Right Ear: Tympanic membrane normal.     Left Ear: Tympanic membrane normal. There is impacted cerumen.  Eyes:     General:        Right eye: No discharge.        Left eye: No discharge.     Pupils: Pupils are equal, round, and reactive to light.  Neck:     Musculoskeletal: Normal range of motion and neck supple.     Thyroid: No thyromegaly.  Cardiovascular:     Rate and Rhythm: Normal rate and regular rhythm.     Heart sounds: Normal heart sounds. No murmur.  Pulmonary:     Effort: Pulmonary effort is normal. No respiratory distress.     Breath sounds: Normal breath sounds. No  wheezing.  Abdominal:     General: Bowel sounds are normal. There is no distension.     Palpations: Abdomen is soft.     Tenderness: There is no abdominal tenderness.  Musculoskeletal: Normal range of motion.        General: No tenderness.     Right lower leg: Edema (trace) present.     Left lower leg: Edema (trace) present.  Skin:    General: Skin is warm and dry.      Findings: No erythema or rash.  Neurological:     Mental Status: He is alert and oriented to person, place, and time.     Cranial Nerves: No cranial nerve deficit.     Deep Tendon Reflexes: Reflexes are normal and symmetric.  Psychiatric:        Behavior: Behavior normal.        Thought Content: Thought content normal.        Judgment: Judgment normal.    Left ear impacted, nurse uses warm water and peroxide, removes impaction. Pt tolerates well.    BP (!) 142/91   Pulse 63   Temp 98.6 F (37 C) (Oral)   Ht 6' (1.829 m)   Wt 261 lb (118.4 kg)   BMI 35.40 kg/m      Assessment & Plan:  Steve Conner comes in today with chief complaint of Medical Management of Chronic Issues   Diagnosis and orders addressed:  1. Essential hypertension - CMP14+EGFR - CBC with Differential/Platelet - amLODipine (NORVASC) 10 MG tablet; Take 1 tablet (10 mg total) by mouth daily.  Dispense: 90 tablet; Refill: 3 - furosemide (LASIX) 40 MG tablet; Take 1 tablet (40 mg total) by mouth 2 (two) times daily.  Dispense: 90 tablet; Refill: 1 - metoprolol succinate (TOPROL-XL) 50 MG 24 hr tablet; Take 1 tablet (50 mg total) by mouth daily. Take with or immediately following a meal.  Dispense: 90 tablet; Refill: 2  2. Gastroesophageal reflux disease, esophagitis presence not specified - CMP14+EGFR - CBC with Differential/Platelet - pantoprazole (PROTONIX) 20 MG tablet; Take 1 tablet (20 mg total) by mouth daily. (Needs to be seen before next refill)  Dispense: 90 tablet; Refill: 1  3. Hypothyroidism, unspecified type - CMP14+EGFR - CBC with Differential/Platelet - TSH - levothyroxine (SYNTHROID, LEVOTHROID) 25 MCG tablet; Take 1 tablet (25 mcg total) by mouth daily before breakfast. (Needs to be seen before next refill)  Dispense: 90 tablet; Refill: 1  4. Hypertriglyceridemia - CMP14+EGFR - CBC with Differential/Platelet - Lipid panel - rosuvastatin (CRESTOR) 10 MG tablet; Take 1 tablet (10 mg  total) by mouth every evening.  Dispense: 90 tablet; Refill: 1  5. Metabolic syndrome - BJY78+GNFA - CBC with Differential/Platelet - Lipid panel - rosuvastatin (CRESTOR) 10 MG tablet; Take 1 tablet (10 mg total) by mouth every evening.  Dispense: 90 tablet; Refill: 1  6. Morbid obesity ([place]) - CMP14+EGFR - CBC with Differential/Platelet  7. Vitamin D deficiency - CMP14+EGFR - CBC with Differential/Platelet - VITAMIN D 25 Hydroxy (Vit-D Deficiency, Fractures)  8. Impacted cerumen of left ear  9. Peripheral edema    Labs pending Health Maintenance reviewed Diet and exercise encouraged  Follow up plan: 6 months    Evelina Dun, [name]

## 2018-10-19 NOTE — Patient Instructions (Signed)

## 2018-10-20 ENCOUNTER — Other Ambulatory Visit: Payer: Self-pay | Admitting: Family

## 2018-10-20 LAB — VITAMIN D 25 HYDROXY (VIT D DEFICIENCY, FRACTURES): Vit D, 25-Hydroxy: 18 ng/mL — ABNORMAL LOW (ref 30.0–100.0)

## 2018-10-20 LAB — LIPID PANEL
Chol/HDL Ratio: 11.7 ratio — ABNORMAL HIGH (ref 0.0–5.0)
Cholesterol, Total: 233 mg/dL — ABNORMAL HIGH (ref 100–199)
HDL: 20 mg/dL — ABNORMAL LOW (ref >39)
Triglycerides: 981 mg/dL (ref 0–149)

## 2018-10-20 LAB — CBC WITH DIFFERENTIAL/PLATELET
Basophils Absolute: 0 10*3/uL (ref 0.0–0.2)
Basos: 1 %
EOS (ABSOLUTE): 0.2 10*3/uL (ref 0.0–0.4)
Eos: 3 %
Hematocrit: 38.8 % (ref 37.5–51.0)
Hemoglobin: 13.2 g/dL (ref 13.0–17.7)
Immature Grans (Abs): 0 10*3/uL (ref 0.0–0.1)
Immature Granulocytes: 1 %
Lymphocytes Absolute: 1.9 10*3/uL (ref 0.7–3.1)
Lymphs: 28 %
MCH: 27 pg (ref 26.6–33.0)
MCHC: 34 g/dL (ref 31.5–35.7)
MCV: 79 fL (ref 79–97)
Monocytes Absolute: 0.5 10*3/uL (ref 0.1–0.9)
Monocytes: 8 %
Neutrophils Absolute: 4.1 10*3/uL (ref 1.4–7.0)
Neutrophils: 59 %
Platelets: 264 10*3/uL (ref 150–450)
RBC: 4.89 x10E6/uL (ref 4.14–5.80)
RDW: 14.5 % (ref 11.6–15.4)
WBC: 6.8 10*3/uL (ref 3.4–10.8)

## 2018-10-20 LAB — CMP14+EGFR
ALT: 30 IU/L (ref 0–44)
AST: 22 IU/L (ref 0–40)
Albumin/Globulin Ratio: 1.3 (ref 1.2–2.2)
Albumin: 3.9 g/dL — ABNORMAL LOW (ref 4.0–5.0)
Alkaline Phosphatase: 115 IU/L (ref 39–117)
BUN/Creatinine Ratio: 12 (ref 9–20)
BUN: 13 mg/dL (ref 6–24)
Bilirubin Total: 0.4 mg/dL (ref 0.0–1.2)
CO2: 23 mmol/L (ref 20–29)
Calcium: 9.2 mg/dL (ref 8.7–10.2)
Chloride: 98 mmol/L (ref 96–106)
Creatinine, Ser: 1.09 mg/dL (ref 0.76–1.27)
GFR calc Af Amer: 94 mL/min/{1.73_m2} (ref >59)
GFR calc non Af Amer: 82 mL/min/{1.73_m2} (ref >59)
Globulin, Total: 3.1 g/dL (ref 1.5–4.5)
Glucose: 93 mg/dL (ref 65–99)
Potassium: 4.1 mmol/L (ref 3.5–5.2)
Sodium: 139 mmol/L (ref 134–144)
Total Protein: 7 g/dL (ref 6.0–8.5)

## 2018-10-20 LAB — TSH: TSH: 5.8 u[IU]/mL — ABNORMAL HIGH (ref 0.450–4.500)

## 2018-10-20 MED ORDER — LEVOTHYROXINE SODIUM 50 MCG PO TABS: 50.0000 ug | ORAL_TABLET | Freq: Every day | ORAL | 3 refills | Status: DC

## 2018-10-20 MED ORDER — ROSUVASTATIN CALCIUM 20 MG PO TABS: 20.0000 mg | ORAL_TABLET | Freq: Every day | ORAL | 11 refills | Status: DC

## 2018-10-20 MED ORDER — VITAMIN D (ERGOCALCIFEROL) 1.25 MG (50000 UNIT) PO CAPS: 50000.0000 [IU] | ORAL_CAPSULE | ORAL | 3 refills | Status: AC

## 2019-01-21 ENCOUNTER — Other Ambulatory Visit: Payer: Self-pay

## 2019-01-21 ENCOUNTER — Ambulatory Visit (INDEPENDENT_AMBULATORY_CARE_PROVIDER_SITE_OTHER): Payer: Commercial Managed Care - PPO

## 2019-01-21 ENCOUNTER — Encounter: Payer: Self-pay | Admitting: Family Medicine

## 2019-01-21 ENCOUNTER — Ambulatory Visit (INDEPENDENT_AMBULATORY_CARE_PROVIDER_SITE_OTHER): Payer: Commercial Managed Care - PPO | Admitting: Family Medicine

## 2019-01-21 VITALS — BP 134/86 | HR 63 | Temp 98.6°F | Ht 72.0 in | Wt 275.0 lb

## 2019-01-21 DIAGNOSIS — R0609 Other forms of dyspnea: Secondary | ICD-10-CM

## 2019-01-21 DIAGNOSIS — M7989 Other specified soft tissue disorders: Secondary | ICD-10-CM | POA: Diagnosis not present

## 2019-01-21 DIAGNOSIS — I10 Essential (primary) hypertension: Secondary | ICD-10-CM

## 2019-01-21 MED ORDER — FUROSEMIDE 40 MG PO TABS: 40.0000 mg | ORAL_TABLET | Freq: Two times a day (BID) | ORAL | 1 refills | Status: DC

## 2019-01-21 NOTE — Patient Instructions (Signed)
Edema  Edema is when you have too much fluid in your body or under your skin. Edema may make your legs, feet, and ankles swell up. Swelling is also common in looser tissues, like around your eyes. This is a common condition. It gets more common as you get older. There are many possible causes of edema. Eating too much salt (sodium) and being on your feet or sitting for a long time can cause edema in your legs, feet, and ankles. Hot weather may make edema worse. Edema is usually painless. Your skin may look swollen or shiny. Follow these instructions at home:  Keep the swollen body part raised (elevated) above the level of your heart when you are sitting or lying down.  Do not sit still or stand for a long time.  Do not wear tight clothes. Do not wear garters on your upper legs.  Exercise your legs. This can help the swelling go down.  Wear elastic bandages or support stockings as told by your doctor.  Eat a low-salt (low-sodium) diet to reduce fluid as told by your doctor.  Depending on the cause of your swelling, you may need to limit how much fluid you drink (fluid restriction).  Take over-the-counter and prescription medicines only as told by your doctor. Contact a doctor if:  Treatment is not working.  You have heart, liver, or kidney disease and have symptoms of edema.  You have sudden and unexplained weight gain. Get help right away if:  You have shortness of breath or chest pain.  You cannot breathe when you lie down.  You have pain, redness, or warmth in the swollen areas.  You have heart, liver, or kidney disease and get edema all of a sudden.  You have a fever and your symptoms get worse all of a sudden. Summary  Edema is when you have too much fluid in your body or under your skin.  Edema may make your legs, feet, and ankles swell up. Swelling is also common in looser tissues, like around your eyes.  Raise (elevate) the swollen body part above the level of your  heart when you are sitting or lying down.  Follow your doctor's instructions about diet and how much fluid you can drink (fluid restriction). This information is not intended to replace advice given to you by your health care provider. Make sure you discuss any questions you have with your health care provider. Document Released: 12/18/2007 Document Revised: 07/04/2017 Document Reviewed: 07/19/2016 Elsevier Patient Education  2020 [employer]  

## 2019-01-21 NOTE — Progress Notes (Signed)
Subjective:  Patient ID: Steve Conner, male    DOB: 04/07/1971, 48 y.o.   MRN: [id number]  Chief Complaint:  Leg Swelling   HPI: Steve Conner is a 48 y.o. male presenting on 01/21/2019 for Leg Swelling  Pt presents today for bilateral lower extremity swelling. Pt states this started over a year ago and continues to get worse. States he has mentioned this to his cardiologist, no new medications per pt. Pt states he does have slight exertional shortness of breath. No orthopnea, PND, fatigue, syncope, dizziness, chest pain, or palpitations. Pt states he is only taking his furosemide once daily, not twice daily as prescribed. His last TSH was slightly elevated and his Synthroid dose was increased. He has had a 14 pound weight gain since April 2020.  Relevant past medical, surgical, family, and social history reviewed and updated as indicated.  Allergies and medications reviewed and updated.   Past Medical History:  Diagnosis Date  . GERD (gastroesophageal reflux disease)   . Hyperlipidemia   . Hypertension   . Obesity (BMI 30.0-34.9) 10/24/2015  . Thyroid disease     Past Surgical History:  Procedure Laterality Date  . No prior surgery      Social History   Socioeconomic History  . Marital status: Married    Spouse name: Not on file  . Number of children: 2  . Years of education: Not on file  . Highest education level: Not on file  Occupational History  . Not on file  Social Needs  . Financial resource strain: Not on file  . Food insecurity    Worry: Not on file    Inability: Not on file  . Transportation needs    Medical: Not on file    Non-medical: Not on file  Tobacco Use  . Smoking status: Never Smoker  . Smokeless tobacco: Never Used  Substance and Sexual Activity  . Alcohol use: No  . Drug use: No  . Sexual activity: Not on file  Lifestyle  . Physical activity    Days per week: Not on file    Minutes per session: Not on file  . Stress: Not on file   Relationships  . Social Herbalist on phone: Not on file    Gets together: Not on file    Attends religious service: Not on file    Active member of club or organization: Not on file    Attends meetings of clubs or organizations: Not on file    Relationship status: Not on file  . Intimate partner violence    Fear of current or ex partner: Not on file    Emotionally abused: Not on file    Physically abused: Not on file    Forced sexual activity: Not on file  Other Topics Concern  . Not on file  Social History Narrative  . Not on file    Outpatient Encounter Medications as of 01/21/2019  Medication Sig  . furosemide (LASIX) 40 MG tablet Take 1 tablet (40 mg total) by mouth 2 (two) times daily.  Marland Kitchen levothyroxine (SYNTHROID, LEVOTHROID) 50 MCG tablet Take 1 tablet (50 mcg total) by mouth daily.  . metoprolol succinate (TOPROL-XL) 50 MG 24 hr tablet Take 1 tablet (50 mg total) by mouth daily. Take with or immediately following a meal.  . pantoprazole (PROTONIX) 20 MG tablet Take 1 tablet (20 mg total) by mouth daily. (Needs to be seen before next refill)  . potassium chloride  SA (K-DUR,KLOR-CON) 20 MEQ tablet Take 1 tablet (20 mEq total) by mouth 2 (two) times daily.  . rosuvastatin (CRESTOR) 20 MG tablet Take 1 tablet (20 mg total) by mouth at bedtime.  . Vitamin D, Ergocalciferol, (DRISDOL) 1.25 MG (50000 UT) CAPS capsule Take 1 capsule (50,000 Units total) by mouth every 7 (seven) days.  Marland Kitchen amLODipine (NORVASC) 10 MG tablet Take 1 tablet (10 mg total) by mouth daily.   No facility-administered encounter medications on file as of 01/21/2019.     Allergies  Allergen Reactions  . Lisinopril Swelling  . Penicillins Rash    Review of Systems  Constitutional: Negative for chills, fatigue and fever.  Respiratory: Positive for shortness of breath (exertional). Negative for cough, wheezing and stridor.   Cardiovascular: Positive for leg swelling. Negative for chest pain and  palpitations.  Gastrointestinal: Negative for abdominal distention and abdominal pain.  Endocrine: Negative for cold intolerance, heat intolerance, polydipsia, polyphagia and polyuria.  Genitourinary: Negative for decreased urine volume and difficulty urinating.  Neurological: Negative for dizziness, tremors, seizures, syncope, facial asymmetry, speech difficulty, weakness, light-headedness, numbness and headaches.  Psychiatric/Behavioral: Negative for confusion.  All other systems reviewed and are negative.       Objective:  BP 134/86   Pulse 63   Temp 98.6 F (37 C) (Oral)   Ht 6' (1.829 m)   Wt 275 lb (124.7 kg)   BMI 37.30 kg/m    Wt Readings from Last 3 Encounters:  01/21/19 275 lb (124.7 kg)  10/19/18 261 lb (118.4 kg)  07/14/18 269 lb (122 kg)    Physical Exam Vitals signs and nursing note reviewed.  Constitutional:      General: He is not in acute distress.    Appearance: Normal appearance. He is well-developed and well-groomed. He is not ill-appearing, toxic-appearing or diaphoretic.  HENT:     Head: Normocephalic and atraumatic.     Jaw: There is normal jaw occlusion.     Right Ear: Hearing normal.     Left Ear: Hearing normal.     Nose: Nose normal.     Mouth/Throat:     Lips: Pink.     Mouth: Mucous membranes are moist.     Pharynx: Oropharynx is clear. Uvula midline.  Eyes:     General: Lids are normal.     Extraocular Movements: Extraocular movements intact.     Conjunctiva/sclera: Conjunctivae normal.     Pupils: Pupils are equal, round, and reactive to light.  Neck:     Musculoskeletal: Normal range of motion and neck supple.     Thyroid: No thyroid mass, thyromegaly or thyroid tenderness.     Vascular: No carotid bruit or JVD.     Trachea: Trachea and phonation normal.  Cardiovascular:     Rate and Rhythm: Normal rate and regular rhythm.     Chest Wall: PMI is not displaced. No thrill.     Pulses: Normal pulses.     Heart sounds: Normal heart  sounds. No murmur. No friction rub. No gallop. No S3 sounds.   Pulmonary:     Effort: Pulmonary effort is normal. No respiratory distress.     Breath sounds: Normal breath sounds. No wheezing, rhonchi or rales.  Abdominal:     General: Bowel sounds are normal. There is no distension or abdominal bruit.     Palpations: Abdomen is soft. There is no hepatomegaly or splenomegaly.     Tenderness: There is no abdominal tenderness. There is no right CVA  tenderness or left CVA tenderness.     Hernia: No hernia is present.  Musculoskeletal: Normal range of motion.     Right lower leg: 1+ Pitting Edema present.     Left lower leg: 1+ Pitting Edema present.  Lymphadenopathy:     Cervical: No cervical adenopathy.  Skin:    General: Skin is warm and dry.     Capillary Refill: Capillary refill takes less than 2 seconds.     Coloration: Skin is not cyanotic, jaundiced or pale.     Findings: No rash.  Neurological:     General: No focal deficit present.     Mental Status: He is alert and oriented to person, place, and time.     Cranial Nerves: Cranial nerves are intact.     Sensory: Sensation is intact.     Motor: Motor function is intact.     Coordination: Coordination is intact.     Gait: Gait is intact.     Deep Tendon Reflexes: Reflexes are normal and symmetric.  Psychiatric:        Attention and Perception: Attention and perception normal.        Mood and Affect: Mood and affect normal.        Speech: Speech normal.        Behavior: Behavior normal. Behavior is cooperative.        Thought Content: Thought content normal.        Cognition and Memory: Cognition and memory normal.        Judgment: Judgment normal.     Results for orders placed or performed in visit on 10/19/18  CMP14+EGFR  Result Value Ref Range   Glucose 93 65 - 99 mg/dL   BUN 13 6 - 24 mg/dL   Creatinine, Ser 1.09 0.76 - 1.27 mg/dL   GFR calc non Af Amer 82 >59 mL/min/1.73   GFR calc Af Amer 94 >59 mL/min/1.73    BUN/Creatinine Ratio 12 9 - 20   Sodium 139 134 - 144 mmol/L   Potassium 4.1 3.5 - 5.2 mmol/L   Chloride 98 96 - 106 mmol/L   CO2 23 20 - 29 mmol/L   Calcium 9.2 8.7 - 10.2 mg/dL   Total Protein 7.0 6.0 - 8.5 g/dL   Albumin 3.9 (L) 4.0 - 5.0 g/dL   Globulin, Total 3.1 1.5 - 4.5 g/dL   Albumin/Globulin Ratio 1.3 1.2 - 2.2   Bilirubin Total 0.4 0.0 - 1.2 mg/dL   Alkaline Phosphatase 115 39 - 117 IU/L   AST 22 0 - 40 IU/L   ALT 30 0 - 44 IU/L  CBC with Differential/Platelet  Result Value Ref Range   WBC 6.8 3.4 - 10.8 x10E3/uL   RBC 4.89 4.14 - 5.80 x10E6/uL   Hemoglobin 13.2 13.0 - 17.7 g/dL   Hematocrit 38.8 37.5 - 51.0 %   MCV 79 79 - 97 fL   MCH 27.0 26.6 - 33.0 pg   MCHC 34.0 31.5 - 35.7 g/dL   RDW 14.5 11.6 - 15.4 %   Platelets 264 150 - 450 x10E3/uL   Neutrophils 59 Not Estab. %   Lymphs 28 Not Estab. %   Monocytes 8 Not Estab. %   Eos 3 Not Estab. %   Basos 1 Not Estab. %   Neutrophils Absolute 4.1 1.4 - 7.0 x10E3/uL   Lymphocytes Absolute 1.9 0.7 - 3.1 x10E3/uL   Monocytes Absolute 0.5 0.1 - 0.9 x10E3/uL   EOS (ABSOLUTE) 0.2 0.0 - 0.4  x10E3/uL   Basophils Absolute 0.0 0.0 - 0.2 x10E3/uL   Immature Granulocytes 1 Not Estab. %   Immature Grans (Abs) 0.0 0.0 - 0.1 x10E3/uL  Lipid panel  Result Value Ref Range   Cholesterol, Total 233 (H) 100 - 199 mg/dL   Triglycerides 981 (HH) 0 - 149 mg/dL   HDL 20 (L) >39 mg/dL   VLDL Cholesterol Cal Comment 5 - 40 mg/dL   LDL Calculated Comment 0 - 99 mg/dL   Chol/HDL Ratio 11.7 (H) 0.0 - 5.0 ratio  TSH  Result Value Ref Range   TSH 5.800 (H) 0.450 - 4.500 uIU/mL  VITAMIN D 25 Hydroxy (Vit-D Deficiency, Fractures)  Result Value Ref Range   Vit D, 25-Hydroxy 18.0 (L) 30.0 - 100.0 ng/mL     X-Ray: Chest: No acute findings. Preliminary x-ray reading by Monia Pouch, [name]-C, [facility].  EKG: Sinus bradycardia with ST changes or ectopy.   Pertinent labs & imaging results that were available during my care of the patient were  reviewed by me and considered in my medical decision making.  Assessment & Plan:  Jeziah was seen today for leg swelling.  Diagnoses and all orders for this visit:  Leg swelling Dyspnea on exertion Start taking Lasix 40 mg twice daily. Make follow up appointment with cardiology. Avoid excessive salt in the diet. Elevate legs when sitting and as often as possible. Labs pending. Chest xray and EKG unremarkable in office. Report any new or worsening symptoms. Report a weight gain of 3 pounds in one day or 5 pounds in one week. Follow up in 1 week for reevaluation.  -     Thyroid Panel With TSH -     CMP14+EGFR -     CBC with Differential/Platelet -     EKG 12-Lead -     DG Chest 2 View; Future -     Brain natriuretic peptide     Continue all other maintenance medications.  Follow up plan: Return in about 1 week (around 01/28/2019), or if symptoms worsen or fail to improve, for lower extremity edema.  Educational handout given for edema  The above assessment and management plan was discussed with the patient. The patient verbalized understanding of and has agreed to the management plan. Patient is aware to call the clinic if symptoms persist or worsen. Patient is aware when to return to the clinic for a follow-up visit. Patient educated on when it is appropriate to go to the emergency department.   Monia Pouch, [name]-C [facility] [phone]

## 2019-01-22 LAB — CMP14+EGFR
ALT: 27 IU/L (ref 0–44)
AST: 22 IU/L (ref 0–40)
Albumin/Globulin Ratio: 1.3 (ref 1.2–2.2)
Albumin: 3.9 g/dL — ABNORMAL LOW (ref 4.0–5.0)
Alkaline Phosphatase: 89 IU/L (ref 39–117)
BUN/Creatinine Ratio: 15 (ref 9–20)
BUN: 17 mg/dL (ref 6–24)
Bilirubin Total: 0.3 mg/dL (ref 0.0–1.2)
CO2: 23 mmol/L (ref 20–29)
Calcium: 8.9 mg/dL (ref 8.7–10.2)
Chloride: 99 mmol/L (ref 96–106)
Creatinine, Ser: 1.12 mg/dL (ref 0.76–1.27)
GFR calc Af Amer: 91 mL/min/{1.73_m2} (ref >59)
GFR calc non Af Amer: 79 mL/min/{1.73_m2} (ref >59)
Globulin, Total: 3 g/dL (ref 1.5–4.5)
Glucose: 105 mg/dL — ABNORMAL HIGH (ref 65–99)
Potassium: 3.2 mmol/L — ABNORMAL LOW (ref 3.5–5.2)
Sodium: 140 mmol/L (ref 134–144)
Total Protein: 6.9 g/dL (ref 6.0–8.5)

## 2019-01-22 LAB — CBC WITH DIFFERENTIAL/PLATELET
Basophils Absolute: 0 10*3/uL (ref 0.0–0.2)
Basos: 1 %
EOS (ABSOLUTE): 0.2 10*3/uL (ref 0.0–0.4)
Eos: 3 %
Hematocrit: 36.5 % — ABNORMAL LOW (ref 37.5–51.0)
Hemoglobin: 12 g/dL — ABNORMAL LOW (ref 13.0–17.7)
Immature Grans (Abs): 0.1 10*3/uL (ref 0.0–0.1)
Immature Granulocytes: 1 %
Lymphocytes Absolute: 1.9 10*3/uL (ref 0.7–3.1)
Lymphs: 26 %
MCH: 26.8 pg (ref 26.6–33.0)
MCHC: 32.9 g/dL (ref 31.5–35.7)
MCV: 82 fL (ref 79–97)
Monocytes Absolute: 0.8 10*3/uL (ref 0.1–0.9)
Monocytes: 11 %
Neutrophils Absolute: 4.4 10*3/uL (ref 1.4–7.0)
Neutrophils: 58 %
Platelets: 230 10*3/uL (ref 150–450)
RBC: 4.47 x10E6/uL (ref 4.14–5.80)
RDW: 15.2 % (ref 11.6–15.4)
WBC: 7.3 10*3/uL (ref 3.4–10.8)

## 2019-01-22 LAB — THYROID PANEL WITH TSH
Free Thyroxine Index: 1.8 (ref 1.2–4.9)
T3 Uptake Ratio: 29 % (ref 24–39)
T4, Total: 6.2 ug/dL (ref 4.5–12.0)
TSH: 3.03 u[IU]/mL (ref 0.450–4.500)

## 2019-01-22 LAB — BRAIN NATRIURETIC PEPTIDE: BNP: 7.7 pg/mL (ref 0.0–100.0)

## 2019-02-08 ENCOUNTER — Ambulatory Visit (INDEPENDENT_AMBULATORY_CARE_PROVIDER_SITE_OTHER): Payer: Commercial Managed Care - PPO | Admitting: Family Medicine

## 2019-02-08 ENCOUNTER — Encounter: Payer: Self-pay | Admitting: Family Medicine

## 2019-02-08 ENCOUNTER — Other Ambulatory Visit: Payer: Self-pay

## 2019-02-08 VITALS — BP 117/75 | HR 58 | Temp 98.0°F | Ht 72.0 in | Wt 271.0 lb

## 2019-02-08 DIAGNOSIS — R600 Localized edema: Secondary | ICD-10-CM

## 2019-02-08 DIAGNOSIS — D649 Anemia, unspecified: Secondary | ICD-10-CM

## 2019-02-08 DIAGNOSIS — E876 Hypokalemia: Secondary | ICD-10-CM | POA: Diagnosis not present

## 2019-02-08 DIAGNOSIS — I872 Venous insufficiency (chronic) (peripheral): Secondary | ICD-10-CM | POA: Diagnosis not present

## 2019-02-08 MED ORDER — METOLAZONE 2.5 MG PO TABS: 2.5000 mg | ORAL_TABLET | Freq: Every day | ORAL | 0 refills | Status: DC

## 2019-02-08 MED ORDER — TRIAMCINOLONE ACETONIDE 0.1 % EX CREA: 1.0000 "application " | TOPICAL_CREAM | Freq: Two times a day (BID) | CUTANEOUS | 0 refills | Status: DC

## 2019-02-08 NOTE — Progress Notes (Signed)
Subjective:  Patient ID: Steve Conner, male    DOB: 1971/05/23, 48 y.o.   MRN: [id number]  Patient Care Team: Sharion Balloon, [name] as PCP - General (Family Medicine) Stanford Breed Denice Bors, MD as Consulting Physician (Cardiology)   Chief Complaint:  Edema (follow up )   HPI: Steve Conner is a 48 y.o. male presenting on 02/08/2019 for Edema (follow up )   Pt presents today for follow up of lower extremity edema. Pt presented to the office on 01/21/2019 for bilateral lower extremity edema. P reported this problem had started over a year ago and continued to get worse. He mentioned it to his cardiologist and nothing was changed. Pt did have slight exertional shortness of breath but that has resolved. No orthopnea, PND, fatigue, syncope, dizziness, chest pain, or palpitations. He was only taking his lasix once daily, not twice daily as prescribed. This was increased at last appointment. Labs, EKG, and chest xray were completed at last visit. He was slightly hypokalemic and anemic. Will repeat those labs today. Pt states the swelling has improved greatly but is still present. He has not tried compression hose or socks as discussed. He has had a 4 pound weight loss since last visit. He does report a slight discoloration of his medial ankles. States this started about 4 days ago. No pain, weeping, or increased warmth. No injury or known exposures.     Relevant past medical, surgical, family, and social history reviewed and updated as indicated.  Allergies and medications reviewed and updated. Date reviewed: Chart in Epic.   Past Medical History:  Diagnosis Date  . GERD (gastroesophageal reflux disease)   . Hyperlipidemia   . Hypertension   . Obesity (BMI 30.0-34.9) 10/24/2015  . Thyroid disease     Past Surgical History:  Procedure Laterality Date  . No prior surgery      Social History   Socioeconomic History  . Marital status: Married    Spouse name: Not on file  . Number of  children: 2  . Years of education: Not on file  . Highest education level: Not on file  Occupational History  . Not on file  Social Needs  . Financial resource strain: Not on file  . Food insecurity    Worry: Not on file    Inability: Not on file  . Transportation needs    Medical: Not on file    Non-medical: Not on file  Tobacco Use  . Smoking status: Never Smoker  . Smokeless tobacco: Never Used  Substance and Sexual Activity  . Alcohol use: No  . Drug use: No  . Sexual activity: Not on file  Lifestyle  . Physical activity    Days per week: Not on file    Minutes per session: Not on file  . Stress: Not on file  Relationships  . Social Herbalist on phone: Not on file    Gets together: Not on file    Attends religious service: Not on file    Active member of club or organization: Not on file    Attends meetings of clubs or organizations: Not on file    Relationship status: Not on file  . Intimate partner violence    Fear of current or ex partner: Not on file    Emotionally abused: Not on file    Physically abused: Not on file    Forced sexual activity: Not on file  Other Topics Concern  .  Not on file  Social History Narrative  . Not on file    Outpatient Encounter Medications as of 02/08/2019  Medication Sig  . amLODipine (NORVASC) 10 MG tablet Take 1 tablet (10 mg total) by mouth daily.  . furosemide (LASIX) 40 MG tablet Take 1 tablet (40 mg total) by mouth 2 (two) times daily.  Marland Kitchen levothyroxine (SYNTHROID, LEVOTHROID) 50 MCG tablet Take 1 tablet (50 mcg total) by mouth daily.  . metoprolol succinate (TOPROL-XL) 50 MG 24 hr tablet Take 1 tablet (50 mg total) by mouth daily. Take with or immediately following a meal.  . pantoprazole (PROTONIX) 20 MG tablet Take 1 tablet (20 mg total) by mouth daily. (Needs to be seen before next refill)  . potassium chloride SA (K-DUR,KLOR-CON) 20 MEQ tablet Take 1 tablet (20 mEq total) by mouth 2 (two) times daily.  .  rosuvastatin (CRESTOR) 20 MG tablet Take 1 tablet (20 mg total) by mouth at bedtime.  . Vitamin D, Ergocalciferol, (DRISDOL) 1.25 MG (50000 UT) CAPS capsule Take 1 capsule (50,000 Units total) by mouth every 7 (seven) days.  . metolazone (ZAROXOLYN) 2.5 MG tablet Take 1 tablet (2.5 mg total) by mouth daily.  Marland Kitchen triamcinolone cream (KENALOG) 0.1 % Apply 1 application topically 2 (two) times daily.   No facility-administered encounter medications on file as of 02/08/2019.     Allergies  Allergen Reactions  . Lisinopril Swelling  . Penicillins Rash    Review of Systems  Constitutional: Negative for activity change, appetite change, chills, diaphoresis, fatigue, fever and unexpected weight change.  Eyes: Negative for photophobia and visual disturbance.  Respiratory: Negative for cough, shortness of breath and wheezing.   Cardiovascular: Positive for leg swelling. Negative for chest pain and palpitations.  Gastrointestinal: Negative for abdominal distention, abdominal pain, anal bleeding, blood in stool, constipation, diarrhea, nausea, rectal pain and vomiting.  Genitourinary: Negative for decreased urine volume, difficulty urinating and hematuria.  Musculoskeletal: Negative for arthralgias and myalgias.  Skin: Positive for color change. Negative for pallor.  Neurological: Negative for dizziness, tremors, seizures, syncope, facial asymmetry, speech difficulty, weakness, light-headedness, numbness and headaches.  Hematological: Does not bruise/bleed easily.  Psychiatric/Behavioral: Negative for confusion.  All other systems reviewed and are negative.       Objective:  BP 117/75   Pulse (!) 58   Temp 98 F (36.7 C)   Ht 6' (1.829 m)   Wt 271 lb (122.9 kg)   BMI 36.75 kg/m    Wt Readings from Last 3 Encounters:  02/08/19 271 lb (122.9 kg)  01/21/19 275 lb (124.7 kg)  10/19/18 261 lb (118.4 kg)    Physical Exam Vitals signs and nursing note reviewed.  Constitutional:       General: He is not in acute distress.    Appearance: Normal appearance. He is well-developed and well-groomed. He is not ill-appearing, toxic-appearing or diaphoretic.  HENT:     Head: Normocephalic and atraumatic.     Jaw: There is normal jaw occlusion.     Right Ear: Hearing normal.     Left Ear: Hearing normal.     Nose: Nose normal.     Mouth/Throat:     Lips: Pink.     Mouth: Mucous membranes are moist.     Pharynx: Oropharynx is clear. Uvula midline.  Eyes:     General: Lids are normal.     Extraocular Movements: Extraocular movements intact.     Conjunctiva/sclera: Conjunctivae normal.     Pupils: Pupils are equal,  round, and reactive to light.  Neck:     Musculoskeletal: Normal range of motion and neck supple.     Thyroid: No thyroid mass, thyromegaly or thyroid tenderness.     Vascular: No carotid bruit or JVD.     Trachea: Trachea and phonation normal.  Cardiovascular:     Rate and Rhythm: Normal rate and regular rhythm.     Chest Wall: PMI is not displaced.     Pulses: Normal pulses.     Heart sounds: Normal heart sounds. No murmur. No friction rub. No gallop.   Pulmonary:     Effort: Pulmonary effort is normal. No respiratory distress.     Breath sounds: Normal breath sounds. No wheezing.  Abdominal:     General: Bowel sounds are normal. There is no distension or abdominal bruit.     Palpations: Abdomen is soft. There is no hepatomegaly or splenomegaly.     Tenderness: There is no abdominal tenderness. There is no right CVA tenderness or left CVA tenderness.     Hernia: No hernia is present.  Musculoskeletal: Normal range of motion.     Right lower leg: 1+ Pitting Edema present.     Left lower leg: 1+ Pitting Edema present.  Lymphadenopathy:     Cervical: No cervical adenopathy.  Skin:    General: Skin is warm and dry.     Capillary Refill: Capillary refill takes less than 2 seconds.     Coloration: Skin is not cyanotic, jaundiced or pale.     Findings:  Erythema, petechiae and rash present. Rash is purpuric.     Comments: Hyperpigmentation, reddish in color, to bilateral medial ankles. No increased warmth, tenderness, drainage, or swelling.   Neurological:     General: No focal deficit present.     Mental Status: He is alert and oriented to person, place, and time.     Cranial Nerves: Cranial nerves are intact.     Sensory: Sensation is intact.     Motor: Motor function is intact.     Coordination: Coordination is intact.     Gait: Gait is intact.     Deep Tendon Reflexes: Reflexes are normal and symmetric.  Psychiatric:        Attention and Perception: Attention and perception normal.        Mood and Affect: Mood and affect normal.        Speech: Speech normal.        Behavior: Behavior normal. Behavior is cooperative.        Thought Content: Thought content normal.        Cognition and Memory: Cognition and memory normal.        Judgment: Judgment normal.     Results for orders placed or performed in visit on 01/21/19  Thyroid Panel With TSH  Result Value Ref Range   TSH 3.030 0.450 - 4.500 uIU/mL   T4, Total 6.2 4.5 - 12.0 ug/dL   T3 Uptake Ratio 29 24 - 39 %   Free Thyroxine Index 1.8 1.2 - 4.9  CMP14+EGFR  Result Value Ref Range   Glucose 105 (H) 65 - 99 mg/dL   BUN 17 6 - 24 mg/dL   Creatinine, Ser 1.12 0.76 - 1.27 mg/dL   GFR calc non Af Amer 79 >59 mL/min/1.73   GFR calc Af Amer 91 >59 mL/min/1.73   BUN/Creatinine Ratio 15 9 - 20   Sodium 140 134 - 144 mmol/L   Potassium 3.2 (L) 3.5 - 5.2  mmol/L   Chloride 99 96 - 106 mmol/L   CO2 23 20 - 29 mmol/L   Calcium 8.9 8.7 - 10.2 mg/dL   Total Protein 6.9 6.0 - 8.5 g/dL   Albumin 3.9 (L) 4.0 - 5.0 g/dL   Globulin, Total 3.0 1.5 - 4.5 g/dL   Albumin/Globulin Ratio 1.3 1.2 - 2.2   Bilirubin Total 0.3 0.0 - 1.2 mg/dL   Alkaline Phosphatase 89 39 - 117 IU/L   AST 22 0 - 40 IU/L   ALT 27 0 - 44 IU/L  CBC with Differential/Platelet  Result Value Ref Range   WBC 7.3 3.4  - 10.8 x10E3/uL   RBC 4.47 4.14 - 5.80 x10E6/uL   Hemoglobin 12.0 (L) 13.0 - 17.7 g/dL   Hematocrit 36.5 (L) 37.5 - 51.0 %   MCV 82 79 - 97 fL   MCH 26.8 26.6 - 33.0 pg   MCHC 32.9 31.5 - 35.7 g/dL   RDW 15.2 11.6 - 15.4 %   Platelets 230 150 - 450 x10E3/uL   Neutrophils 58 Not Estab. %   Lymphs 26 Not Estab. %   Monocytes 11 Not Estab. %   Eos 3 Not Estab. %   Basos 1 Not Estab. %   Neutrophils Absolute 4.4 1.4 - 7.0 x10E3/uL   Lymphocytes Absolute 1.9 0.7 - 3.1 x10E3/uL   Monocytes Absolute 0.8 0.1 - 0.9 x10E3/uL   EOS (ABSOLUTE) 0.2 0.0 - 0.4 x10E3/uL   Basophils Absolute 0.0 0.0 - 0.2 x10E3/uL   Immature Granulocytes 1 Not Estab. %   Immature Grans (Abs) 0.1 0.0 - 0.1 x10E3/uL  Brain natriuretic peptide  Result Value Ref Range   BNP 7.7 0.0 - 100.0 pg/mL       Pertinent labs & imaging results that were available during my care of the patient were reviewed by me and considered in my medical decision making.  Assessment & Plan:  Ocean was seen today for edema.  Diagnoses and all orders for this visit:  Edema of both lower extremities Doing better since increasing Lasix to 40 mg. Has not initiated compression hose. Limit sodium in diet. Elevate legs when sitting.  -     metolazone (ZAROXOLYN) 2.5 MG tablet; Take 1 tablet (2.5 mg total) by mouth daily.  Hypokalemia Last potassium 3.2. will recheck today. Asymptomatic. Report any new or worsening symptoms.  -     CMP14+EGFR  Low hemoglobin No abnormal bleeding or bruising. Last Hgb 12. Will recheck today.  -     CBC with Differential/Platelet  Venous stasis dermatitis of both lower extremities Bilateral medial ankle hyperpigmentation. No drainage. Slight scaling.  -     triamcinolone cream (KENALOG) 0.1 %; Apply 1 application topically 2 (two) times daily.     Continue all other maintenance medications.  Follow up plan: Return in about 6 weeks (around 03/22/2019), or if symptoms worsen or fail to improve, for  venous stasis dermatitis .   The above assessment and management plan was discussed with the patient. The patient verbalized understanding of and has agreed to the management plan. Patient is aware to call the clinic if symptoms persist or worsen. Patient is aware when to return to the clinic for a follow-up visit. Patient educated on when it is appropriate to go to the emergency department.   Monia Pouch, [name]-C [facility] [phone] 02/08/19

## 2019-02-09 LAB — CBC WITH DIFFERENTIAL/PLATELET
Basophils Absolute: 0 10*3/uL (ref 0.0–0.2)
Basos: 0 %
EOS (ABSOLUTE): 0.2 10*3/uL (ref 0.0–0.4)
Eos: 3 %
Hematocrit: 36.4 % — ABNORMAL LOW (ref 37.5–51.0)
Hemoglobin: 12.2 g/dL — ABNORMAL LOW (ref 13.0–17.7)
Immature Grans (Abs): 0 10*3/uL (ref 0.0–0.1)
Immature Granulocytes: 0 %
Lymphocytes Absolute: 2 10*3/uL (ref 0.7–3.1)
Lymphs: 27 %
MCH: 26.6 pg (ref 26.6–33.0)
MCHC: 33.5 g/dL (ref 31.5–35.7)
MCV: 80 fL (ref 79–97)
Monocytes Absolute: 0.6 10*3/uL (ref 0.1–0.9)
Monocytes: 8 %
Neutrophils Absolute: 4.6 10*3/uL (ref 1.4–7.0)
Neutrophils: 62 %
Platelets: 233 10*3/uL (ref 150–450)
RBC: 4.58 x10E6/uL (ref 4.14–5.80)
RDW: 15.1 % (ref 11.6–15.4)
WBC: 7.4 10*3/uL (ref 3.4–10.8)

## 2019-02-09 LAB — CMP14+EGFR
ALT: 30 IU/L (ref 0–44)
AST: 20 IU/L (ref 0–40)
Albumin/Globulin Ratio: 1.3 (ref 1.2–2.2)
Albumin: 4.1 g/dL (ref 4.0–5.0)
Alkaline Phosphatase: 92 IU/L (ref 39–117)
BUN/Creatinine Ratio: 15 (ref 9–20)
BUN: 18 mg/dL (ref 6–24)
Bilirubin Total: 0.3 mg/dL (ref 0.0–1.2)
CO2: 23 mmol/L (ref 20–29)
Calcium: 8.9 mg/dL (ref 8.7–10.2)
Chloride: 101 mmol/L (ref 96–106)
Creatinine, Ser: 1.24 mg/dL (ref 0.76–1.27)
GFR calc Af Amer: 81 mL/min/{1.73_m2} (ref >59)
GFR calc non Af Amer: 70 mL/min/{1.73_m2} (ref >59)
Globulin, Total: 3.1 g/dL (ref 1.5–4.5)
Glucose: 122 mg/dL — ABNORMAL HIGH (ref 65–99)
Potassium: 3.8 mmol/L (ref 3.5–5.2)
Sodium: 142 mmol/L (ref 134–144)
Total Protein: 7.2 g/dL (ref 6.0–8.5)

## 2019-02-11 LAB — HGB A1C W/O EAG: Hgb A1c MFr Bld: 5 % (ref 4.8–5.6)

## 2019-02-11 LAB — SPECIMEN STATUS REPORT

## 2019-03-19 ENCOUNTER — Other Ambulatory Visit: Payer: Self-pay | Admitting: Family

## 2019-03-19 DIAGNOSIS — K219 Gastro-esophageal reflux disease without esophagitis: Secondary | ICD-10-CM

## 2019-08-30 ENCOUNTER — Other Ambulatory Visit: Payer: Self-pay

## 2019-08-31 ENCOUNTER — Ambulatory Visit: Payer: Commercial Managed Care - PPO | Admitting: Family Medicine

## 2019-08-31 ENCOUNTER — Encounter: Payer: Self-pay | Admitting: Family Medicine

## 2019-08-31 ENCOUNTER — Other Ambulatory Visit: Payer: Self-pay

## 2019-08-31 VITALS — BP 125/82 | HR 51 | Temp 97.3°F | Ht 73.0 in | Wt 236.6 lb

## 2019-08-31 DIAGNOSIS — M7041 Prepatellar bursitis, right knee: Secondary | ICD-10-CM | POA: Diagnosis not present

## 2019-08-31 MED ORDER — METHYLPREDNISOLONE ACETATE 40 MG/ML IJ SUSP
40.0000 mg | Freq: Once | INTRAMUSCULAR | Status: AC
  Administered 2019-08-31: 17:00:00 40 mg via INTRAMUSCULAR

## 2019-08-31 NOTE — Progress Notes (Signed)
Assessment & Plan:  1. Prepatellar bursitis of right knee - Fluid drawn off right knee prepatellar bursitis and depo-medrol injected. Education provided on prepatellar bursitis.  - methylPREDNISolone acetate (DEPO-MEDROL) injection 40 mg   Follow up plan: Return if symptoms worsen or fail to improve.  Hendricks Limes, MSN, APRN, [name]-C [facility]  Subjective:   Patient ID: Steve Conner, male    DOB: 1970/09/18, 49 y.o.   MRN: [id number]  HPI: Sakari Alkhatib is a 49 y.o. male presenting on 08/31/2019 for Joint Swelling (right knee- X 1 1/2 months after starting to lose weight)  Patient reports right knee swelling that he first noticed a month and a half ago. He is not sure that is actually when it appeared or if it was there previously but he was not aware due to his weight. Patient has recently lost ~40 lbs. He denies any pain. 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     ROS: Negative unless specifically indicated above in HPI.   Relevant past medical history reviewed and updated as indicated.   Allergies and medications reviewed and updated.   Current Outpatient Medications:  .  furosemide (LASIX) 40 MG tablet, Take 1 tablet (40 mg total) by mouth 2 (two) times daily. (Patient taking differently: Take 40 mg by mouth daily. ), Disp: 90 tablet, Rfl: 1 .  levothyroxine (SYNTHROID, LEVOTHROID) 50 MCG tablet, Take 1 tablet (50 mcg total) by mouth daily., Disp: 90 tablet, Rfl: 3 .  metoprolol succinate (TOPROL-XL) 50 MG 24 hr tablet, Take 1 tablet (50 mg total) by mouth daily. Take with or immediately following a meal., Disp: 90 tablet, Rfl: 2 .  pantoprazole (PROTONIX) 20 MG tablet, TAKE 1 TABLET (20 MG TOTAL) BY MOUTH DAILY. (NEEDS TO BE SEEN BEFORE NEXT REFILL), Disp: 30 tablet, Rfl: 5 .  potassium chloride SA (K-DUR,KLOR-CON) 20 MEQ tablet, Take 1 tablet (20 mEq total) by mouth 2 (two) times daily.,  Disp: 180 tablet, Rfl: 1 .  rosuvastatin (CRESTOR) 20 MG tablet, Take 1 tablet (20 mg total) by mouth at bedtime., Disp: 30 tablet, Rfl: 11 .  triamcinolone cream (KENALOG) 0.1 %, Apply 1 application topically 2 (two) times daily., Disp: 30 g, Rfl: 0 .  Vitamin D, Ergocalciferol, (DRISDOL) 1.25 MG (50000 UT) CAPS capsule, Take 1 capsule (50,000 Units total) by mouth every 7 (seven) days., Disp: 12 capsule, Rfl: 3 .  amLODipine (NORVASC) 10 MG tablet, Take 1 tablet (10 mg total) by mouth daily., Disp: 90 tablet, Rfl: 3 .  metolazone (ZAROXOLYN) 2.5 MG tablet, Take 1 tablet (2.5 mg total) by mouth daily., Disp: 3 tablet, Rfl: 0  Allergies  Allergen Reactions  . Lisinopril Swelling  . Penicillins Rash    Objective:   BP 125/82   Pulse (!) 51   Temp (!) 97.3 F (36.3 C) (Temporal)   Ht 6\' 1"  (1.854 m)   Wt 236 lb 9.6 oz (107.3 kg)   SpO2 98%   BMI 31.22 kg/m    Physical Exam Vitals reviewed.  Constitutional:      General: He is not in acute distress.    Appearance: Normal appearance. He is obese. He is not ill-appearing, toxic-appearing or diaphoretic.  HENT:     Head: Normocephalic and atraumatic.  Eyes:     General: No scleral icterus.       Right eye: No discharge.  Left eye: No discharge.     Conjunctiva/sclera: Conjunctivae normal.  Cardiovascular:     Rate and Rhythm: Normal rate.  Pulmonary:     Effort: Pulmonary effort is normal. No respiratory distress.  Musculoskeletal:        General: Normal range of motion.     Cervical back: Normal range of motion.     Comments: Right pre-patellar bursitis without erythema or warmth.   Skin:    General: Skin is warm and dry.  Neurological:     Mental Status: He is alert and oriented to person, place, and time. Mental status is at baseline.  Psychiatric:        Mood and Affect: Mood normal.        Behavior: Behavior normal.        Thought Content: Thought content normal.        Judgment: Judgment normal.     Performed by M. Rakes, NP.  Joint Injection/Arthrocentesis  Date/Time: 08/31/2019 4:43 PM Performed by: Gwenlyn Fudge, [name] Authorized by: Gwenlyn Fudge, [name]  Body area: knee Joint: right knee Local anesthesia used: yes  Anesthesia: Local anesthesia used: yes Local Anesthetic: topical anesthetic  Sedation: Patient sedated: no  Needle size: 22 G Ultrasound guidance: no Approach: lateral Aspirate: serous and yellow Methylprednisolone amount: 40 mg Patient tolerance: patient tolerated the procedure well with no immediate complications

## 2019-08-31 NOTE — Patient Instructions (Addendum)
Prepatellar Bursitis  Prepatellar bursitis is inflammation of the prepatellar bursa, which is a fluid-filled sac that cushions the kneecap (patella). Prepatellar bursitis happens when fluid builds up in this sac and causes it to swell. The condition causes knee pain. What are the causes? This condition may be caused by:  Constant pressure on the knees from kneeling.  A hit to the knee.  Falling on the knee.  Infection from bacteria.  Moving the knee often in a forceful way. What increases the risk? You are more likely to develop this condition if:  You play sports that have a high risk of falling on the knee or being hit on the knee. These include football, wrestling, basketball, or soccer.  You do work in which you kneel for long periods of time, such as roofing, plumbing, or gardening.  You have another inflammatory condition, such as gout or rheumatoid arthritis. What are the signs or symptoms? The most common symptom of this condition is knee pain that gets better with rest. Other symptoms include:  Swelling on the front of the kneecap.  Warmth in the knee.  Tenderness with activity.  Redness in the knee.  Inability to bend the knee or to kneel. How is this diagnosed? This condition is diagnosed based on:  A physical exam. Your health care provider will compare your knees and check for tenderness and pain while moving your knee.  Your medical history.  Tests to check for infection. These may include blood tests and tests on the fluid in the bursa.  Imaging tests, such as X-ray, MRI, or ultrasound, to check for damage in the patella, or fluid buildup and swelling in the bursa. How is this treated? This condition may be treated by:  Resting the knee.  Putting ice on the knee.  Taking medicines, such as: ? NSAIDs. These can help to reduce pain and swelling. ? Antibiotics. These may be needed if you have an infection. ? Steroids. These are used to reduce  swelling and inflammation, and may be prescribed if other treatments are not helping.  Raising (elevating) the knee while resting.  Doing exercises to help you maintain movement (physical therapy). These may be recommended after pain and swelling improve.  Having a procedure to remove fluid from the bursa. This may be done if other treatments are not helping.  Having surgery to remove the bursa. This may be done if you have a severe infection or if the condition keeps coming back after treatment. Follow these instructions at home: Medicines  Take over-the-counter and prescription medicines only as told by your health care provider.  If you were prescribed an antibiotic medicine, take it as told by your health care provider. Do not stop taking the antibiotic even if you start to feel better. Managing pain, stiffness, and swelling   If directed, put ice on the injured area. ? Put ice in a plastic bag. ? Place a towel between your skin and the bag. ? Leave the ice on for 20 minutes, 2-3 times a day.  Elevate the injured area above the level of your heart while you are sitting or lying down. Activity  Do not use the injured limb to support your body weight until your health care provider says that you can.  Rest your knee.  Avoid activities that cause knee pain.  Return to your normal activities as told by your health care provider. Ask your health care provider what activities are safe for you.  Do exercises as   told by your health care provider. General instructions  Ask your health care provider when it is safe for you to drive.  Do not use any products that contain nicotine or tobacco, such as cigarettes, e-cigarettes, and chewing tobacco. These can delay healing. If you need help quitting, ask your health care provider.  Keep all follow-up visits as told by your health care provider. This is important. How is this prevented?  Warm up and stretch before being  active.  Cool down and stretch after being active.  Give your body time to rest between periods of activity.  Maintain physical fitness, including strength and flexibility.  Be safe and responsible while being active. This will help you to avoid falls.  Wear knee pads if you have to kneel for a long period of time. Contact a health care provider if:  Your symptoms do not improve or get worse.  Your symptoms keep coming back after treatment.  You develop a fever and have warmth, redness, or swelling over your knee. Summary  Prepatellar bursitis is inflammation of the prepatellar bursa, which is a fluid-filled sac that cushions the kneecap (patella).  This condition may be caused by injury or constant pressure on the knee. It may also be caused by an infection from bacteria.  Symptoms of this condition include pain, swelling, warmth, and tenderness in the knee.  Follow instructions from your health care provider about taking medicines, resting, and doing activities.  Contact your health care provider if your symptoms do not improve, get worse, or keep coming back after treatment. This information is not intended to replace advice given to you by your health care provider. Make sure you discuss any questions you have with your health care provider. Document Revised: 10/23/2018 Document Reviewed: 09/10/2018 Elsevier Patient Education  2020 [employer]  

## 2019-09-12 ENCOUNTER — Encounter: Payer: Self-pay | Admitting: Student

## 2019-09-12 NOTE — Progress Notes (Signed)
Cardiology Office Note:    Date:  09/14/2019   ID:  Steve Conner, DOB 23-Nov-1970, MRN [id number]  PCP:  Junie Spencer, [name]  Cardiologist:  Olga Millers, MD  Electrophysiologist:  None   Referring MD: Junie Spencer, [name]   Chief Complaint: follow up of chest pain and hypertension  History of Present Illness:    Steve Conner is a 49 y.o. male with a history of hypertension, hyperlipidemia, hypothyroidism, lower extremity edema, GERD, and obesity who is followed by Dr. Jens Som and presents today for routine follow-up.   Patient initially seen by Dr Jens Som in 09/2016 for evaluation of hypertension. Previous cardiac work-up included ETT in 01/2016 which was normal. At that initial visit, patient reported vague chest discomfort at night as well as some dyspnea on exertion but no exertional chest pain. BP 160/108 at that time. Chest pain was felt to be atypical so no additional ischemic work-up was ordered. HCTZ was decreased to 25mg  daily and patient was started on Amlodipine 5mg  daily. He was seen again by , PA-C, in 06/2017 at which time he reported that his insurance ran out so he stopped his Amlodipine and never got the Echo. He continued to complain of significant dyspnea on exertion as well as some lower extremity edema. HCTZ was stopped and he was started on Lasix and he was restarted on Amlodipine. Echo was reordered and showed mildly dilated LV with EF of 55-60%. He was seen by Corine Shelter again a few weeks later and reported some intermittent pain across his chest as well as some palpitations at night and a recent near syncopal episode. He was started on Toprol-XL. Coronary CTA and Holter monitor were recommended but it does not look like either were done. Patient had concerns about the cost of coronary CTA. Patient was last seen by Dr. 07/2017 in 06/2018 at which time he continued to report burning chest pain both at rest and with exertion. Coronary CTA was again recommended and  ordered but has not been done.   Patient was seen by PCP in 01/2019 for lower extremity edema and weight gain. BNP was normal. Patient was only taking Lasix once daily so this was increased to twice daily. He was seen back for follow-up a few weeks later and noted improvement in edema but it was still present. He had not tried compression stocking as recommended. He was started on Metolazone 2.5mg  daily in addition to his Lasix. Does not look like he has had any repeat labs since then.   Patient presents today for follow-up. Here alone. Patient states he has been doing well from a cardiac standpoint. He has lost about 50 lbs since right before Christmas. Breathing at night, snoring, and lower extremity edema have all improved with his weight loss. No chest pain, shortness of breath, orthopnea, or PND. No palpitations, lightheadedness, dizziness, or syncope. No abnormal bleeding.   Of note, patient states he has only been taking Lasix 40mg  once daily as there were not enough tablets to take twice daily. Still taking Metolazone 2.5mg  once daily. Only taking Potassium 20 mEq once daily.   Past Medical History:  Diagnosis Date  . GERD (gastroesophageal reflux disease)   . Hyperlipidemia   . Hypertension   . Hypothyroidism   . Obesity (BMI 30.0-34.9) 10/24/2015    Past Surgical History:  Procedure Laterality Date  . No prior surgery      Current Medications: Current Meds  Medication Sig  . furosemide (LASIX) 40 MG tablet  Take 1 tablet (40 mg total) by mouth 2 (two) times daily.  Marland Kitchen levothyroxine (SYNTHROID, LEVOTHROID) 50 MCG tablet Take 1 tablet (50 mcg total) by mouth daily.  . metoprolol succinate (TOPROL-XL) 50 MG 24 hr tablet Take 1 tablet (50 mg total) by mouth daily. Take with or immediately following a meal.  . pantoprazole (PROTONIX) 20 MG tablet TAKE 1 TABLET (20 MG TOTAL) BY MOUTH DAILY. (NEEDS TO BE SEEN BEFORE NEXT REFILL)  . potassium chloride SA (KLOR-CON) 20 MEQ tablet Take 1  tablet (20 mEq total) by mouth 2 (two) times daily.  . rosuvastatin (CRESTOR) 20 MG tablet Take 1 tablet (20 mg total) by mouth at bedtime.  . triamcinolone cream (KENALOG) 0.1 % Apply 1 application topically 2 (two) times daily.  . Vitamin D, Ergocalciferol, (DRISDOL) 1.25 MG (50000 UT) CAPS capsule Take 1 capsule (50,000 Units total) by mouth every 7 (seven) days.  . [DISCONTINUED] furosemide (LASIX) 40 MG tablet Take 1 tablet (40 mg total) by mouth 2 (two) times daily. (Patient taking differently: Take 40 mg by mouth daily. )  . [DISCONTINUED] metolazone (ZAROXOLYN) 2.5 MG tablet Take 1 tablet (2.5 mg total) by mouth daily.  . [DISCONTINUED] potassium chloride SA (K-DUR,KLOR-CON) 20 MEQ tablet Take 1 tablet (20 mEq total) by mouth 2 (two) times daily.     Allergies:   Lisinopril and Penicillins   Social History   Socioeconomic History  . Marital status: Married    Spouse name: Not on file  . Number of children: 2  . Years of education: Not on file  . Highest education level: Not on file  Occupational History  . Not on file  Tobacco Use  . Smoking status: Never Smoker  . Smokeless tobacco: Never Used  Substance and Sexual Activity  . Alcohol use: No  . Drug use: No  . Sexual activity: Not on file  Other Topics Concern  . Not on file  Social History Narrative  . Not on file   Social Determinants of Health   Financial Resource Strain:   . Difficulty of Paying Living Expenses: Not on file  Food Insecurity:   . Worried About Programme researcher, broadcasting/film/video in the Last Year: Not on file  . Ran Out of Food in the Last Year: Not on file  Transportation Needs:   . Lack of Transportation (Medical): Not on file  . Lack of Transportation (Non-Medical): Not on file  Physical Activity:   . Days of Exercise per Week: Not on file  . Minutes of Exercise per Session: Not on file  Stress:   . Feeling of Stress : Not on file  Social Connections:   . Frequency of Communication with Friends and  Family: Not on file  . Frequency of Social Gatherings with Friends and Family: Not on file  . Attends Religious Services: Not on file  . Active Member of Clubs or Organizations: Not on file  . Attends Banker Meetings: Not on file  . Marital Status: Not on file     Family History: The patient's family history includes Aneurysm (age of onset: 60) in his father; Cancer in his mother; Diabetes in his mother; Heart disease (age of onset: 83) in his mother.  ROS:   Please see the history of present illness.     EKGs/Labs/Other Studies Reviewed:    The following studies were reviewed today:  Exercise Tolerance Test 01/25/2016:  There was no ST segment deviation noted during stress.  Normal stress  test, no signs of coronary artery disease on the parameters and stress test. Patient asymptomatic throughout  no ST segment deviation noted during stress   Normal stress test, no signs of coronary artery disease on the parameters and stress test. Patient asymptomatic throughout. _______________  Echocardiogram 07/04/2017: Study Conclusions: - Left ventricle: The cavity size was mildly dilated. Wall  thickness was normal. Systolic function was normal. The estimated  ejection fraction was in the range of 55% to 60%. The study is  not technically sufficient to allow evaluation of LV diastolic  function.   EKG:  EKG ordered today. EKG personally reviewed and demonstrates sinus bradycardia, rate 56 bpm, wth no acute ST/T changes. Normal PR and QRS interval. QTc 403 ms. .  Recent Labs: 01/21/2019: BNP 7.7; TSH 3.030 02/08/2019: ALT 30; BUN 18; Creatinine, Ser 1.24; Hemoglobin 12.2; Platelets 233; Potassium 3.8; Sodium 142  Recent Lipid Panel    Component Value Date/Time   CHOL 233 (H) 10/19/2018 1037   TRIG 981 (HH) 10/19/2018 1037   HDL 20 (L) 10/19/2018 1037   CHOLHDL 11.7 (H) 10/19/2018 1037   LDLCALC Comment 10/19/2018 1037    Physical Exam:    Vital Signs: BP  135/90   Pulse (!) 56   Temp (!) 97.3 F (36.3 C)   Ht 6' (1.829 m)   Wt 233 lb (105.7 kg)   SpO2 97%   BMI 31.60 kg/m     Wt Readings from Last 3 Encounters:  09/14/19 233 lb (105.7 kg)  08/31/19 236 lb 9.6 oz (107.3 kg)  02/08/19 271 lb (122.9 kg)     General: 49 y.o. male in no acute distress. HEENT: Normocephalic and atraumatic.  Neck: Supple. No carotid bruits. No JVD.  Heart: Mildly bradycardic with regular rhythm. Distinct S1 and S2. No murmurs, gallops, or rubs. Radial pulses 2+ and equal bilaterally. Lungs: No increased work of breathing. Clear to ausculation bilaterally. No wheezes, rhonchi, or rales.  Abdomen: Soft, non-distended, and non-tender to palpation. Bowel sounds present. Extremities: Trace edema of bilateral lower extremities. Skin: Warm and dry.  Neuro: Alert and oriented x3. No focal deficits. Psych: Normal affect. Responds appropriately.   Assessment:    1. Chest pain, unspecified type   2. Lower extremity edema   3. Essential hypertension   4. Hyperlipidemia, unspecified hyperlipidemia type   5. Obesity (BMI 30-39.9)     Plan:    Chest Pain - Patient had complained of chest pain on multiple occasions at previous visits. Coronary CTA recommended/ordered but never done.  - Patient denies any recent chest pain today with recent weight loss.  - EKG showed no acute ST/T changes. - Patient would like to hold off on coronary CTA at this time but will let us know if he has recurrent chest pain.   Chronic Lower Extremity Edema - Patient was seen by PCP in 01/2019 for worsening lower extremity edema and started on Metolazone 2.5mg  daily in addition to Lasix 40mg  twice daily. However, he reports that he is only taking Lasix once daily because he did not have enough tablets in the prescription to take twice daily.  - Edema has improved with recent weight loss. Trace edema noted on exam. - Will stop Metolazone and increase Lasix back to 40mg  twice daily.  Advised patient to take Potassium 20 mEq twice daily with this (has only been taking once daily). Refills given. If edema worsens or patient has weight gain, may need to add back Metolazone on as needed basis. -  Patient has not had labs checked since starting Metolazone so will check CMET and Mg today.    Hypertension - BP 135/90 today. However, patient has not taken his medications in the past 2 days as he has been waiting for them to be refilled. He monitors his BP at home and states systolic BP is normal <130 and diastolic BP is normally in the low 80's. - Continue current medications: Amlodipine 10mg  daily and Toprol-XL 50mg  daily. Patient states he has refills but just needs to pick meds up.   Hyperlipidemia - Most recent lipid panel from 10/2018: Total Cholesterol 233, Triglycerides 981, HDL 20, LDL unable to be calculated due to triglycerides. Crestor was increased to 20mg  daily at that time and exercise/diet recommendations were made.  - Continue Crestor 20mg  daily for now.  - Patient is not fasting. Will come back for fasting lipid panel.  Obesity - Patient has lost about 50 lbs in a little over 2 months by exercising and following low carb diet. Congratulated patient on this and encouraged him to keep staying active and follow a heart healthy diet.   Disposition: Follow up in 6 months with Dr. Jens Som   Medication Adjustments/Labs and Tests Ordered: Current medicines are reviewed at length with the patient today.  Concerns regarding medicines are outlined above.  Orders Placed This Encounter  Procedures  . Lipid panel  . Comprehensive metabolic panel  . Magnesium  . EKG 12-Lead   Meds ordered this encounter  Medications  . furosemide (LASIX) 40 MG tablet    Sig: Take 1 tablet (40 mg total) by mouth 2 (two) times daily.    Dispense:  180 tablet    Refill:  1    NEW DOSE  . potassium chloride SA (KLOR-CON) 20 MEQ tablet    Sig: Take 1 tablet (20 mEq total) by mouth 2 (two)  times daily.    Dispense:  180 tablet    Refill:  1    NEW DOSE    Patient Instructions  Medication Instructions:   STOP Metolazone  Increase Lasix to 40 mg twice daily.  Take potassium twice daily.  *If you need a refill on your cardiac medications before your next appointment, please call your pharmacy*   Lab Work: Your physician recommends that you return for lab work today: BMET, Magnesium  Your physician recommends that you return for a FASTING lipid profile: at your earliest convenience. No appointment needed. Please bring your lab slips with you.  If you have labs (blood work) drawn today and your tests are completely normal, you will receive your results only by: Marland Kitchen MyChart Message (if you have MyChart) OR . A paper copy in the mail If you have any lab test that is abnormal or we need to change your treatment, we will call you to review the results.   Follow-Up: At [facility], you and your health needs are our priority.  As part of our continuing mission to provide you with exceptional heart care, we have created designated Provider Care Teams.  These Care Teams include your primary Cardiologist (physician) and Advanced Practice Providers (APPs -  Physician Assistants and Nurse Practitioners) who all work together to provide you with the care you need, when you need it.  We recommend signing up for the patient portal called "MyChart".  Sign up information is provided on this After Visit Summary.  MyChart is used to connect with patients for Virtual Visits (Telemedicine).  Patients are able to view lab/test  results, encounter notes, upcoming appointments, etc.  Non-urgent messages can be sent to your provider as well.   To learn more about what you can do with MyChart, go to [email or web address].    Your next appointment:   6 month(s)  The format for your next appointment:   In Person  Provider:   Kirk Ruths, MD   Other Instructions Please call our  office 2 months in advance to schedule your 6 month follow-up appointment.     Signed, Darreld Mclean, PA-C  09/14/2019 5:14 PM    [facility]

## 2019-09-14 ENCOUNTER — Encounter: Payer: Self-pay | Admitting: Student

## 2019-09-14 ENCOUNTER — Other Ambulatory Visit: Payer: Self-pay

## 2019-09-14 ENCOUNTER — Ambulatory Visit: Payer: Commercial Managed Care - PPO | Admitting: Student

## 2019-09-14 VITALS — BP 135/90 | HR 56 | Temp 97.3°F | Ht 72.0 in | Wt 233.0 lb

## 2019-09-14 DIAGNOSIS — E785 Hyperlipidemia, unspecified: Secondary | ICD-10-CM | POA: Diagnosis not present

## 2019-09-14 DIAGNOSIS — E669 Obesity, unspecified: Secondary | ICD-10-CM

## 2019-09-14 DIAGNOSIS — R079 Chest pain, unspecified: Secondary | ICD-10-CM

## 2019-09-14 DIAGNOSIS — R600 Localized edema: Secondary | ICD-10-CM | POA: Diagnosis not present

## 2019-09-14 DIAGNOSIS — I10 Essential (primary) hypertension: Secondary | ICD-10-CM

## 2019-09-14 MED ORDER — POTASSIUM CHLORIDE CRYS ER 20 MEQ PO TBCR: 20.0000 meq | EXTENDED_RELEASE_TABLET | Freq: Two times a day (BID) | ORAL | 1 refills | Status: DC

## 2019-09-14 MED ORDER — FUROSEMIDE 40 MG PO TABS: 40.0000 mg | ORAL_TABLET | Freq: Two times a day (BID) | ORAL | 1 refills | Status: DC

## 2019-09-14 NOTE — Patient Instructions (Addendum)
Medication Instructions:   STOP Metolazone  Increase Lasix to 40 mg twice daily.  Take potassium twice daily.  *If you need a refill on your cardiac medications before your next appointment, please call your pharmacy*   Lab Work: Your physician recommends that you return for lab work today: BMET, Magnesium  Your physician recommends that you return for a FASTING lipid profile: at your earliest convenience. No appointment needed. Please bring your lab slips with you.  If you have labs (blood work) drawn today and your tests are completely normal, you will receive your results only by: Marland Kitchen MyChart Message (if you have MyChart) OR . A paper copy in the mail If you have any lab test that is abnormal or we need to change your treatment, we will call you to review the results.   Follow-Up: At [facility], you and your health needs are our priority.  As part of our continuing mission to provide you with exceptional heart care, we have created designated Provider Care Teams.  These Care Teams include your primary Cardiologist (physician) and Advanced Practice Providers (APPs -  Physician Assistants and Nurse Practitioners) who all work together to provide you with the care you need, when you need it.  We recommend signing up for the patient portal called "MyChart".  Sign up information is provided on this After Visit Summary.  MyChart is used to connect with patients for Virtual Visits (Telemedicine).  Patients are able to view lab/test results, encounter notes, upcoming appointments, etc.  Non-urgent messages can be sent to your provider as well.   To learn more about what you can do with MyChart, go to [email or web address].    Your next appointment:   6 month(s)  The format for your next appointment:   In Person  Provider:   Olga Millers, MD   Other Instructions Please call our office 2 months in advance to schedule your 6 month follow-up appointment.

## 2019-09-15 LAB — COMPREHENSIVE METABOLIC PANEL
ALT: 30 IU/L (ref 0–44)
AST: 21 IU/L (ref 0–40)
Albumin/Globulin Ratio: 1.5 (ref 1.2–2.2)
Albumin: 4.1 g/dL (ref 4.0–5.0)
Alkaline Phosphatase: 116 IU/L (ref 39–117)
BUN/Creatinine Ratio: 17 (ref 9–20)
BUN: 18 mg/dL (ref 6–24)
Bilirubin Total: 0.4 mg/dL (ref 0.0–1.2)
CO2: 25 mmol/L (ref 20–29)
Calcium: 9.2 mg/dL (ref 8.7–10.2)
Chloride: 104 mmol/L (ref 96–106)
Creatinine, Ser: 1.08 mg/dL (ref 0.76–1.27)
GFR calc Af Amer: 95 mL/min/{1.73_m2} (ref >59)
GFR calc non Af Amer: 82 mL/min/{1.73_m2} (ref >59)
Globulin, Total: 2.7 g/dL (ref 1.5–4.5)
Glucose: 88 mg/dL (ref 65–99)
Potassium: 4 mmol/L (ref 3.5–5.2)
Sodium: 143 mmol/L (ref 134–144)
Total Protein: 6.8 g/dL (ref 6.0–8.5)

## 2019-09-15 LAB — MAGNESIUM: Magnesium: 2.1 mg/dL (ref 1.6–2.3)

## 2019-10-10 ENCOUNTER — Other Ambulatory Visit: Payer: Self-pay | Admitting: Family

## 2019-10-10 DIAGNOSIS — I10 Essential (primary) hypertension: Secondary | ICD-10-CM

## 2019-11-18 ENCOUNTER — Other Ambulatory Visit: Payer: Self-pay | Admitting: Family

## 2019-11-18 DIAGNOSIS — K219 Gastro-esophageal reflux disease without esophagitis: Secondary | ICD-10-CM

## 2019-11-18 DIAGNOSIS — I10 Essential (primary) hypertension: Secondary | ICD-10-CM

## 2019-12-14 ENCOUNTER — Other Ambulatory Visit: Payer: Self-pay | Admitting: Family

## 2019-12-14 DIAGNOSIS — I10 Essential (primary) hypertension: Secondary | ICD-10-CM

## 2019-12-14 NOTE — Telephone Encounter (Signed)
Detailed message left for patient that he needs to make an appointment.

## 2019-12-15 ENCOUNTER — Other Ambulatory Visit: Payer: Self-pay | Admitting: Family

## 2019-12-15 DIAGNOSIS — K219 Gastro-esophageal reflux disease without esophagitis: Secondary | ICD-10-CM

## 2020-01-14 ENCOUNTER — Other Ambulatory Visit: Payer: Self-pay | Admitting: Family

## 2020-01-14 DIAGNOSIS — K219 Gastro-esophageal reflux disease without esophagitis: Secondary | ICD-10-CM

## 2020-02-15 ENCOUNTER — Ambulatory Visit (INDEPENDENT_AMBULATORY_CARE_PROVIDER_SITE_OTHER): Payer: Commercial Managed Care - PPO | Admitting: Family

## 2020-02-15 ENCOUNTER — Other Ambulatory Visit: Payer: Self-pay

## 2020-02-15 ENCOUNTER — Encounter: Payer: Self-pay | Admitting: Family

## 2020-02-15 VITALS — BP 153/93 | HR 51 | Temp 97.3°F | Ht 72.0 in | Wt 239.6 lb

## 2020-02-15 DIAGNOSIS — R600 Localized edema: Secondary | ICD-10-CM

## 2020-02-15 DIAGNOSIS — E039 Hypothyroidism, unspecified: Secondary | ICD-10-CM

## 2020-02-15 DIAGNOSIS — E8881 Metabolic syndrome: Secondary | ICD-10-CM

## 2020-02-15 DIAGNOSIS — E781 Pure hyperglyceridemia: Secondary | ICD-10-CM

## 2020-02-15 DIAGNOSIS — Z0001 Encounter for general adult medical examination with abnormal findings: Secondary | ICD-10-CM

## 2020-02-15 DIAGNOSIS — K219 Gastro-esophageal reflux disease without esophagitis: Secondary | ICD-10-CM

## 2020-02-15 DIAGNOSIS — I10 Essential (primary) hypertension: Secondary | ICD-10-CM

## 2020-02-15 DIAGNOSIS — Z0000 Encounter for general adult medical examination without abnormal findings: Secondary | ICD-10-CM

## 2020-02-15 MED ORDER — AMLODIPINE BESYLATE 10 MG PO TABS: 10.0000 mg | ORAL_TABLET | Freq: Every day | ORAL | 6 refills | Status: DC

## 2020-02-15 MED ORDER — PANTOPRAZOLE SODIUM 20 MG PO TBEC: 20.0000 mg | DELAYED_RELEASE_TABLET | Freq: Every day | ORAL | 6 refills | Status: DC

## 2020-02-15 MED ORDER — LEVOTHYROXINE SODIUM 50 MCG PO TABS: 50.0000 ug | ORAL_TABLET | Freq: Every day | ORAL | 6 refills | Status: DC

## 2020-02-15 MED ORDER — ROSUVASTATIN CALCIUM 20 MG PO TABS: 20.0000 mg | ORAL_TABLET | Freq: Every day | ORAL | 6 refills | Status: DC

## 2020-02-15 MED ORDER — METOPROLOL SUCCINATE ER 50 MG PO TB24: 50.0000 mg | ORAL_TABLET | Freq: Every day | ORAL | 2 refills | Status: DC

## 2020-02-15 NOTE — Progress Notes (Signed)
Subjective:    Patient ID: Steve Conner, male    DOB: 18-Jun-1971, 49 y.o.   MRN: [id number]  Chief Complaint  Patient presents with  . Medical Management of Chronic Issues    no meds for 3 weeks   . Hypertension   PT presents to the office today for CPE. He is followed by Cardiologists annually for SOB & family hx of CAD.   He states he has been out of his medications for three weeks.  Hypertension This is a chronic problem. The current episode started more than 1 year ago. The problem has been waxing and waning since onset. The problem is uncontrolled. Associated symptoms include malaise/fatigue. Pertinent negatives include no peripheral edema or shortness of breath. Risk factors for coronary artery disease include dyslipidemia, obesity and male gender. Improvement on treatment: Patient has been out of medicaiton for three weeks. There is no history of kidney disease or CAD/MI. Identifiable causes of hypertension include a thyroid problem.  Hyperlipidemia This is a chronic problem. The current episode started more than 1 year ago. The problem is uncontrolled. Recent lipid tests were reviewed and are high. Exacerbating diseases include hypothyroidism. Pertinent negatives include no shortness of breath. Current antihyperlipidemic treatment includes statins. The current treatment provides moderate improvement of lipids. Risk factors for coronary artery disease include dyslipidemia, male sex, hypertension and a sedentary lifestyle.  Thyroid Problem Presents for follow-up visit. Symptoms include anxiety. Patient reports no fatigue. The symptoms have been stable. His past medical history is significant for hyperlipidemia.  Gastroesophageal Reflux He complains of belching and heartburn. This is a chronic problem. The current episode started more than 1 year ago. The problem occurs occasionally. The problem has been unchanged. Pertinent negatives include no fatigue. He has tried a PPI for the  symptoms. The treatment provided moderate relief.      Review of Systems  Constitutional: Positive for malaise/fatigue. Negative for fatigue.  Respiratory: Negative for shortness of breath.   Gastrointestinal: Positive for heartburn.  Psychiatric/Behavioral: The patient is nervous/anxious.   All other systems reviewed and are negative.  Family History  Problem Relation Age of Onset  . Heart disease Mother 30  . Diabetes Mother   . Cancer Mother   . Aneurysm Father 77   Social History   Socioeconomic History  . Marital status: Married    Spouse name: Not on file  . Number of children: 2  . Years of education: Not on file  . Highest education level: Not on file  Occupational History  . Not on file  Tobacco Use  . Smoking status: Never Smoker  . Smokeless tobacco: Never Used  Vaping Use  . Vaping Use: Never used  Substance and Sexual Activity  . Alcohol use: No  . Drug use: No  . Sexual activity: Not on file  Other Topics Concern  . Not on file  Social History Narrative  . Not on file   Social Determinants of Health   Financial Resource Strain:   . Difficulty of Paying Living Expenses:   Food Insecurity:   . Worried About Charity fundraiser in the Last Year:   . Arboriculturist in the Last Year:   Transportation Needs:   . Film/video editor (Medical):   Marland Kitchen Lack of Transportation (Non-Medical):   Physical Activity:   . Days of Exercise per Week:   . Minutes of Exercise per Session:   Stress:   . Feeling of Stress :   Social  Connections:   . Frequency of Communication with Friends and Family:   . Frequency of Social Gatherings with Friends and Family:   . Attends Religious Services:   . Active Member of Clubs or Organizations:   . Attends Archivist Meetings:   Marland Kitchen Marital Status:        Objective:   Physical Exam Vitals reviewed.  Constitutional:      General: He is not in acute distress.    Appearance: He is well-developed.  HENT:      Head: Normocephalic.     Right Ear: Tympanic membrane normal.     Left Ear: Tympanic membrane normal.  Eyes:     General:        Right eye: No discharge.        Left eye: No discharge.     Pupils: Pupils are equal, round, and reactive to light.  Neck:     Thyroid: No thyromegaly.  Cardiovascular:     Rate and Rhythm: Normal rate and regular rhythm.     Heart sounds: Normal heart sounds. No murmur heard.   Pulmonary:     Effort: Pulmonary effort is normal. No respiratory distress.     Breath sounds: Normal breath sounds. No wheezing.  Abdominal:     General: Bowel sounds are normal. There is no distension.     Palpations: Abdomen is soft.     Tenderness: There is no abdominal tenderness.  Musculoskeletal:        General: No tenderness. Normal range of motion.     Cervical back: Normal range of motion and neck supple.  Skin:    General: Skin is warm and dry.     Findings: No erythema or rash.  Neurological:     Mental Status: He is alert and oriented to person, place, and time.     Cranial Nerves: No cranial nerve deficit.     Deep Tendon Reflexes: Reflexes are normal and symmetric.  Psychiatric:        Behavior: Behavior normal.        Thought Content: Thought content normal.        Judgment: Judgment normal.       BP (!) 153/93   Pulse (!) 51   Temp (!) 97.3 F (36.3 C) (Temporal)   Ht 6' (1.829 m)   Wt 239 lb 9.6 oz (108.7 kg)   SpO2 98%   BMI 32.50 kg/m      Assessment & Plan:  Ary Lavine comes in today with chief complaint of Medical Management of Chronic Issues (no meds for 3 weeks ) and Hypertension   Diagnosis and orders addressed:  1. Essential hypertension - metoprolol succinate (TOPROL-XL) 50 MG 24 hr tablet; Take 1 tablet (50 mg total) by mouth daily. Take with or immediately following a meal.  Dispense: 90 tablet; Refill: 2 - amLODipine (NORVASC) 10 MG tablet; Take 1 tablet (10 mg total) by mouth daily.  Dispense: 30 tablet; Refill: 6 -  CMP14+EGFR - CBC with Differential/Platelet  2. Lower extremity edema - CMP14+EGFR - CBC with Differential/Platelet  3. Gastroesophageal reflux disease - pantoprazole (PROTONIX) 20 MG tablet; Take 1 tablet (20 mg total) by mouth daily.  Dispense: 30 tablet; Refill: 6 - CMP14+EGFR - CBC with Differential/Platelet  4. Annual physical exam - CMP14+EGFR - CBC with Differential/Platelet - Lipid panel - PSA, total and free - TSH  5. Gastroesophageal reflux disease, unspecified whether esophagitis present - CMP14+EGFR - CBC with Differential/Platelet  6. Hypothyroidism,  unspecified type - levothyroxine (SYNTHROID) 50 MCG tablet; Take 1 tablet (50 mcg total) by mouth daily.  Dispense: 30 tablet; Refill: 6 - CMP14+EGFR - CBC with Differential/Platelet  7. Morbid obesity ([place]) - CMP14+EGFR - CBC with Differential/Platelet  8. Metabolic syndrome - SYP15+AQWB - CBC with Differential/Platelet  9. Hypertriglyceridemia - rosuvastatin (CRESTOR) 20 MG tablet; Take 1 tablet (20 mg total) by mouth daily.  Dispense: 30 tablet; Refill: 6 - CMP14+EGFR - CBC with Differential/Platelet   Labs pending Health Maintenance reviewed Diet and exercise encouraged  Follow up plan: 6 months    Evelina Dun, [name]

## 2020-02-15 NOTE — Patient Instructions (Signed)

## 2020-02-16 LAB — CMP14+EGFR
ALT: 19 IU/L (ref 0–44)
AST: 18 IU/L (ref 0–40)
Albumin/Globulin Ratio: 1.6 (ref 1.2–2.2)
Albumin: 4.1 g/dL (ref 4.0–5.0)
Alkaline Phosphatase: 91 IU/L (ref 48–121)
BUN/Creatinine Ratio: 16 (ref 9–20)
BUN: 15 mg/dL (ref 6–24)
Bilirubin Total: 0.4 mg/dL (ref 0.0–1.2)
CO2: 23 mmol/L (ref 20–29)
Calcium: 9 mg/dL (ref 8.7–10.2)
Chloride: 101 mmol/L (ref 96–106)
Creatinine, Ser: 0.92 mg/dL (ref 0.76–1.27)
GFR calc Af Amer: 115 mL/min/{1.73_m2} (ref >59)
GFR calc non Af Amer: 99 mL/min/{1.73_m2} (ref >59)
Globulin, Total: 2.6 g/dL (ref 1.5–4.5)
Glucose: 94 mg/dL (ref 65–99)
Potassium: 3.5 mmol/L (ref 3.5–5.2)
Sodium: 141 mmol/L (ref 134–144)
Total Protein: 6.7 g/dL (ref 6.0–8.5)

## 2020-02-16 LAB — CBC WITH DIFFERENTIAL/PLATELET
Basophils Absolute: 0 10*3/uL (ref 0.0–0.2)
Basos: 1 %
EOS (ABSOLUTE): 0.2 10*3/uL (ref 0.0–0.4)
Eos: 3 %
Hematocrit: 40.1 % (ref 37.5–51.0)
Hemoglobin: 13.8 g/dL (ref 13.0–17.7)
Immature Grans (Abs): 0 10*3/uL (ref 0.0–0.1)
Immature Granulocytes: 1 %
Lymphocytes Absolute: 2 10*3/uL (ref 0.7–3.1)
Lymphs: 32 %
MCH: 27.7 pg (ref 26.6–33.0)
MCHC: 34.4 g/dL (ref 31.5–35.7)
MCV: 81 fL (ref 79–97)
Monocytes Absolute: 0.5 10*3/uL (ref 0.1–0.9)
Monocytes: 8 %
Neutrophils Absolute: 3.6 10*3/uL (ref 1.4–7.0)
Neutrophils: 55 %
Platelets: 212 10*3/uL (ref 150–450)
RBC: 4.98 x10E6/uL (ref 4.14–5.80)
RDW: 13.6 % (ref 11.6–15.4)
WBC: 6.4 10*3/uL (ref 3.4–10.8)

## 2020-02-16 LAB — PSA, TOTAL AND FREE
PSA, Free Pct: 13.8 %
PSA, Free: 0.36 ng/mL
Prostate Specific Ag, Serum: 2.6 ng/mL (ref 0.0–4.0)

## 2020-02-16 LAB — LIPID PANEL
Chol/HDL Ratio: 7.4 ratio — ABNORMAL HIGH (ref 0.0–5.0)
Cholesterol, Total: 199 mg/dL (ref 100–199)
HDL: 27 mg/dL — ABNORMAL LOW (ref >39)
LDL Chol Calc (NIH): 83 mg/dL (ref 0–99)
Triglycerides: 548 mg/dL — ABNORMAL HIGH (ref 0–149)
VLDL Cholesterol Cal: 89 mg/dL — ABNORMAL HIGH (ref 5–40)

## 2020-02-16 LAB — TSH: TSH: 4.69 u[IU]/mL — ABNORMAL HIGH (ref 0.450–4.500)

## 2020-02-17 ENCOUNTER — Other Ambulatory Visit: Payer: Self-pay | Admitting: Family

## 2020-02-17 MED ORDER — LEVOTHYROXINE SODIUM 75 MCG PO TABS
75.0000 ug | ORAL_TABLET | Freq: Every day | ORAL | 3 refills | Status: AC
Start: 2020-02-17 — End: 2021-03-14

## 2020-02-17 NOTE — Addendum Note (Signed)
Addended by: Adella Hare B on: 02/17/2020 02:52 PM   Modules accepted: Orders

## 2020-08-17 ENCOUNTER — Ambulatory Visit: Payer: Commercial Managed Care - PPO | Admitting: Family

## 2020-10-24 ENCOUNTER — Other Ambulatory Visit: Payer: Self-pay | Admitting: Family

## 2020-10-24 DIAGNOSIS — K219 Gastro-esophageal reflux disease without esophagitis: Secondary | ICD-10-CM

## 2020-10-24 DIAGNOSIS — E781 Pure hyperglyceridemia: Secondary | ICD-10-CM

## 2020-11-22 ENCOUNTER — Other Ambulatory Visit: Payer: Self-pay | Admitting: Family

## 2020-11-22 DIAGNOSIS — E781 Pure hyperglyceridemia: Secondary | ICD-10-CM

## 2020-11-22 DIAGNOSIS — K219 Gastro-esophageal reflux disease without esophagitis: Secondary | ICD-10-CM

## 2020-11-22 NOTE — Telephone Encounter (Signed)
[facility]. NTBS 30 days given 10/24/20

## 2020-11-22 NOTE — Telephone Encounter (Signed)
Pt aware he ntbs prior to any refills and states he will have to call back to schedule appt when he looks at his schedule.

## 2021-02-14 NOTE — Progress Notes (Signed)
Cardiology Office Note   Date:  02/22/2021   ID:  Steve Conner, DOB 11-16-1970, MRN [id number]  PCP:  Junie Spencer, [name]  Cardiologist:  Olga Millers, MD EP: None  Chief Complaint  Patient presents with   Edema    Worsening LE edema x1 month       History of Present Illness: Steve Conner is a 50 y.o. male with a  PMH of HTN, HLD, hypothyroidism, LE edema, and GERD, who presents for LE edema.  He was last evaluated by cardiology at an outpatient visit with Marjie Skiff, PA-C 09/2019 at which time he was doing well from a cardiac standpoint and had lost ~50lbs in the past 3 months. He noted improvement in his LE edema with weight loss. He had previously been taking lasix 40mg  BID and metolazone 2.5mg  daily for management of his LE edema, though recently self reduced lasix to once daily. He was recommended to stop metolazone at that time. CMET and Mg obtained for monitoring of kidney function/electrolytes which were stable. He was recommended to follow-up in 6 monhts, though has not been seen since that time. His last echocardiogram in 2018 showed EF 55-60%, mildly dilated LV, though not technically sufficient to evaluate LV diastolic function. His last ischemic evaluation was an ETT in 2017 which was without evidence of ischemia. In more recent years he has had complaints of atypical chest pain and was recommended for a coronary CTA, however this study was never completed. Readdressed at his visit with [facility] 09/2019, though patient declined testing at that time.   He recently contacted the office to report worsening LE edema, for which this visit was arranged.   He presents today with ongoing complaints of LE edema. He has gain 35lbs since 02/14/21. He saw his PCP (recently changed providers) last month and his lasix was transition from 40mg  BID to bumex 1mg  daily. Since that time he has continued to have worsening edema. He has some DOE which has been a longstanding issue but no SOB  at rest. No complaints of orthopnea or PND. He has also had intermittent chest pressure once a week which last for 20-30 minutes but is not exertional in nature. He reports episodes of palpitations several times a week lasting for an hour before resolving spontaneously. He has no complaints of dizziness, lightheadedness, or syncope. He reports eating out frequently as he lives alone. We discussed the importance of maintaining a low salt diet. He reports mother has history of CAD s/p CABG in her 76s, father died of an aneurysm at 65.   Past Medical History:  Diagnosis Date   GERD (gastroesophageal reflux disease)    Hyperlipidemia    Hypertension    Hypothyroidism    Obesity (BMI 30.0-34.9) 10/24/2015    Past Surgical History:  Procedure Laterality Date   No prior surgery       Current Outpatient Medications  Medication Sig Dispense Refill   amLODipine (NORVASC) 10 MG tablet Take 1 tablet (10 mg total) by mouth daily. 30 tablet 6   bumetanide (BUMEX) 2 MG tablet Take 1 tablet (2 mg total) by mouth 2 (two) times daily. 60 tablet 0   levothyroxine (SYNTHROID) 75 MCG tablet Take 1 tablet (75 mcg total) by mouth daily. 90 tablet 3   pantoprazole (PROTONIX) 20 MG tablet Take 1 tablet (20 mg total) by mouth daily. (NEEDS TO BE SEEN BEFORE NEXT REFILL) 30 tablet 0   rosuvastatin (CRESTOR) 40 MG tablet Take 1 tablet (  40 mg total) by mouth daily. 90 tablet 3   Vitamin D, Ergocalciferol, (DRISDOL) 1.25 MG (50000 UT) CAPS capsule Take 1 capsule (50,000 Units total) by mouth every 7 (seven) days. 12 capsule 3   metoprolol succinate (TOPROL-XL) 25 MG 24 hr tablet Take 1 tablet (25 mg total) by mouth daily. Take with or immediately following a meal. 90 tablet 3   potassium chloride SA (KLOR-CON) 20 MEQ tablet Take 1 tablet (20 mEq total) by mouth 2 (two) times daily. 180 tablet 1   No current facility-administered medications for this visit.    Allergies:   Lisinopril and Penicillins    Social  History:  The patient  reports that he has never smoked. He has never used smokeless tobacco. He reports that he does not drink alcohol and does not use drugs.   Family History:  The patient's family history includes Aneurysm (age of onset: 11) in his father; Cancer in his mother; Diabetes in his mother; Heart disease (age of onset: 42) in his mother.    ROS:  Please see the history of present illness.   Otherwise, review of systems are positive for none.   All other systems are reviewed and negative.    PHYSICAL EXAM: VS:  BP 126/70 (BP Location: Left Arm, Patient Position: Sitting, Cuff Size: Large)   Pulse 63   Ht 6' (1.829 m)   Wt 274 lb 12.8 oz (124.6 kg)   BMI 37.27 kg/m  , BMI Body mass index is 37.27 kg/m. GEN: Well nourished, well developed, in no acute distress HEENT: sclear anicteric Neck: no JVD, carotid bruits, or masses Cardiac: RRR; no murmurs, rubs, or gallops, 2-3+ LE edema up to knees Respiratory:  clear to auscultation bilaterally, normal work of breathing GI: soft, obese, nontender, nondistended, + BS MS: no deformity or atrophy Skin: warm and dry, no rash Neuro:  Strength and sensation are intact Psych: euthymic mood, full affect   EKG:  EKG is ordered today. The ekg ordered today demonstrates sinus rhythm, rate 63 bpm, no STE/D, no TWI   Recent Labs: No results found for requested labs within last 8760 hours.    Lipid Panel    Component Value Date/Time   CHOL 199 02/15/2020 1537   TRIG 548 (H) 02/15/2020 1537   HDL 27 (L) 02/15/2020 1537   CHOLHDL 7.4 (H) 02/15/2020 1537   LDLCALC 83 02/15/2020 1537      Wt Readings from Last 3 Encounters:  02/22/21 274 lb 12.8 oz (124.6 kg)  02/15/20 239 lb 9.6 oz (108.7 kg)  09/14/19 233 lb (105.7 kg)      Other studies Reviewed: Additional studies/ records that were reviewed today include:   Echocardiogram 2018:  - Left ventricle: The cavity size was mildly dilated. Wall    thickness was normal.  Systolic function was normal. The estimated    ejection fraction was in the range of 55% to 60%. The study is    not technically sufficient to allow evaluation of LV diastolic    function.   ETT 2017:  - There was no ST segment deviation noted during stress.  - Normal stress test, no signs of coronary artery disease on the parameters and stress test. Patient asymptomatic throughout  - no ST segment deviation noted during stress     Normal stress test, no signs of coronary artery disease on the parameters and stress test. Patient asymptomatic throughout.     ASSESSMENT AND PLAN:   1. Acute on chronic  diastolic CHF: patient presents with worsening LE edema and 35lb weight gain since 02/14/21. Recently taken off lasix 40mg  BID and transitioned to bumex 1mg  daily though swelling has progressed. No SOB and lungs are clear on exam. He has 2-3+ LE edema.  - Will update an echocardiogram to evaluate LV function - Will increase bumex to 2mg  BID until seen in follow-up - will see pharmacy 8/16 at 1pm for med titration - Will need BMET at follow-up visit next week - Encouraged low salt diet. Salty 6 given.  - Patient encouraged to continue monitoring daily weights and will bring log to follow-up.  2. Chest pain in patient without known CAD: reports episodes of intermittent non-exertional chest pressure once a week. EKG today is non-ischemic. BP, A1C, and LDL are well controlled. He does not smoke. Mom had CAD s/p CABG in her 97s.  - Will check a coronary CTA to evaluate chest pain  - Continue statin  3. Palpitations: reports episodes several times per week lasting for an hour. No history of arrhythmias. No other associated symptoms - Will restart metoprolol succinate 25mg  daily - Will check a 1 week zio patch monitor to r/o arrhythmias  3. HTN: BP 126/70 today  - Continue amlodipine, metoprolol, and bumex as above  4. HLD: LDL 83 and triglycerides 548 (improved from 98 10/2018).  - Will  increase crestor to 40mg  daily - if tryglycerides not at goal on next FLP, would consider addition of fenofibrate or vascepa   5. Obesity: BMI 37  - Continue to encourage dietary/lifestyle modifications to promote weight loss    Current medicines are reviewed at length with the patient today.  The patient does not have concerns regarding medicines.  The following changes have been made:  As above  Labs/ tests ordered today include:   Orders Placed This Encounter  Procedures   CT CORONARY MORPH W/CTA COR W/SCORE W/CA W/CM &/OR WO/CM   Basic metabolic panel   LONG TERM MONITOR (3-14 DAYS)   EKG 12-Lead   ECHOCARDIOGRAM COMPLETE      Disposition:   FU with pharmacy 02/27/21 and with me in 2 weeks  Signed, 70s, PA-C  02/22/2021 9:40 AM

## 2021-02-22 ENCOUNTER — Ambulatory Visit: Payer: Commercial Managed Care - PPO

## 2021-02-22 ENCOUNTER — Other Ambulatory Visit: Payer: Self-pay

## 2021-02-22 ENCOUNTER — Other Ambulatory Visit: Payer: Self-pay | Admitting: Medical

## 2021-02-22 ENCOUNTER — Ambulatory Visit: Payer: Commercial Managed Care - PPO | Admitting: Medical

## 2021-02-22 ENCOUNTER — Encounter: Payer: Self-pay | Admitting: Medical

## 2021-02-22 VITALS — BP 126/70 | HR 63 | Ht 72.0 in | Wt 274.8 lb

## 2021-02-22 DIAGNOSIS — I10 Essential (primary) hypertension: Secondary | ICD-10-CM

## 2021-02-22 DIAGNOSIS — R600 Localized edema: Secondary | ICD-10-CM

## 2021-02-22 DIAGNOSIS — E669 Obesity, unspecified: Secondary | ICD-10-CM

## 2021-02-22 DIAGNOSIS — R002 Palpitations: Secondary | ICD-10-CM | POA: Diagnosis not present

## 2021-02-22 DIAGNOSIS — E785 Hyperlipidemia, unspecified: Secondary | ICD-10-CM

## 2021-02-22 DIAGNOSIS — R072 Precordial pain: Secondary | ICD-10-CM

## 2021-02-22 DIAGNOSIS — I5033 Acute on chronic diastolic (congestive) heart failure: Secondary | ICD-10-CM | POA: Diagnosis not present

## 2021-02-22 DIAGNOSIS — E781 Pure hyperglyceridemia: Secondary | ICD-10-CM

## 2021-02-22 MED ORDER — ROSUVASTATIN CALCIUM 20 MG PO TABS
20.0000 mg | ORAL_TABLET | Freq: Every day | ORAL | 0 refills | Status: DC
Start: 2021-02-22 — End: 2021-02-22

## 2021-02-22 MED ORDER — METOPROLOL SUCCINATE ER 25 MG PO TB24
25.0000 mg | ORAL_TABLET | Freq: Every day | ORAL | 3 refills | Status: DC
Start: 2021-02-22 — End: 2021-02-27

## 2021-02-22 MED ORDER — ROSUVASTATIN CALCIUM 40 MG PO TABS: 40.0000 mg | ORAL_TABLET | Freq: Every day | ORAL | 3 refills | Status: DC

## 2021-02-22 MED ORDER — POTASSIUM CHLORIDE CRYS ER 20 MEQ PO TBCR: 20.0000 meq | EXTENDED_RELEASE_TABLET | Freq: Two times a day (BID) | ORAL | 1 refills | Status: DC

## 2021-02-22 MED ORDER — BUMETANIDE 2 MG PO TABS: 2.0000 mg | ORAL_TABLET | Freq: Two times a day (BID) | ORAL | 0 refills | Status: DC

## 2021-02-22 NOTE — Progress Notes (Unsigned)
Patient enrolled for [employer] to mail a 7 day ZIO XT monitor to his address on file. 

## 2021-02-22 NOTE — Patient Instructions (Addendum)
Medication Instructions:  Start Bumex 2 mg (1 Tablet Twice Daily.),Start Metoprolol Succinate 25 mg ( 1 Tablet Daily.), Potasium 20 meq (1 Tablet Twice Daily.). Start Crestor 40 mg ( 1 Tablet Daily). *If you need a refill on your cardiac medications before your next appointment, please call your pharmacy*   Lab Work: BMET ( To Be Done on Tuesday August 16) If you have labs (blood work) drawn today and your tests are completely normal, you will receive your results only by: MyChart Message (if you have MyChart) OR A paper copy in the mail If you have any lab test that is abnormal or we need to change your treatment, we will call you to review the results.   Testing/Procedures: [facility], [address]. Your physician has requested that you have cardiac CT. Cardiac computed tomography (CT) is a painless test that uses an x-ray machine to take clear, detailed pictures of your heart. For further information please visit [email or web address] Please follow instruction sheet as given.     [address] Your physician has requested that you have an echocardiogram. Echocardiography is a painless test that uses sound waves to create images of your heart. It provides your doctor with information about the size and shape of your heart and how well your heart's chambers and valves are working. This procedure takes approximately one hour. There are no restrictions for this procedure.   ZIO XT- Long Term Monitor Instructions  Your physician has requested you wear a ZIO patch monitor for 7 days.  This is a single patch monitor. [employer] supplies one patch monitor per enrollment. Additional stickers are not available. Please do not apply patch if you will be having a Nuclear Stress Test,  Echocardiogram, Cardiac CT, MRI, or Chest Xray during the period you would be wearing the  monitor. The patch cannot be worn during these tests. You cannot remove and re-apply the   ZIO XT patch monitor.  Your ZIO patch monitor will be mailed 3 day USPS to your address on file. It may take 3-5 days  to receive your monitor after you have been enrolled.  Once you have received your monitor, please review the enclosed instructions. Your monitor  has already been registered assigning a specific monitor serial # to you.  Billing and Patient Assistance Program Information  We have supplied [employer] with any of your insurance information on file for billing purposes. [employer] offers a sliding scale Patient Assistance Program for patients that do not have  insurance, or whose insurance does not completely cover the cost of the ZIO monitor.  You must apply for the Patient Assistance Program to qualify for this discounted rate.  To apply, please call [employer] at [phone], select option 4, select option 2, ask to apply for  Patient Assistance Program. Meredeth Ide will ask your household income, and how many people  are in your household. They will quote your out-of-pocket cost based on that information.  [employer] will also be able to set up a 91-month, interest-free payment plan if needed.  Applying the monitor   Shave hair from upper left chest.  Hold abrader disc by orange tab. Rub abrader in 40 strokes over the upper left chest as  indicated in your monitor instructions.  Clean area with 4 enclosed alcohol pads. Let dry.  Apply patch as indicated in monitor instructions. Patch will be placed under collarbone on left  side of chest with arrow pointing upward.  Rub patch adhesive  wings for 2 minutes. Remove white label marked "1". Remove the white  label marked "2". Rub patch adhesive wings for 2 additional minutes.  While looking in a mirror, press and release button in center of patch. A small green light will  flash 3-4 times. This will be your only indicator that the monitor has been turned on.  Do not shower for the first 24 hours. You may shower after the first 24 hours.   Press the button if you feel a symptom. You will hear a small click. Record Date, Time and  Symptom in the Patient Logbook.  When you are ready to remove the patch, follow instructions on the last 2 pages of Patient  Logbook. Stick patch monitor onto the last page of Patient Logbook.  Place Patient Logbook in the blue and white box. Use locking tab on box and tape box closed  securely. The blue and white box has prepaid postage on it. Please place it in the mailbox as  soon as possible. Your physician should have your test results approximately 7 days after the  monitor has been mailed back to [facility].  Call [facility] Customer Care at [phone] if you have questions regarding  your ZIO XT patch monitor. Call them immediately if you see an orange light blinking on your  monitor.  If your monitor falls off in less than 4 days, contact our Monitor department at [phone].  If your monitor becomes loose or falls off after 4 days call [employer] at [phone] for  suggestions on securing your monitor  Follow-Up: At [facility], you and your health needs are our priority.  As part of our continuing mission to provide you with exceptional heart care, we have created designated Provider Care Teams.  These Care Teams include your primary Cardiologist (physician) and Advanced Practice Providers (APPs -  Physician Assistants and Nurse Practitioners) who all work together to provide you with the care you need, when you need it.  Your next appointment:   3 week(s)  The format for your next appointment:   In Person  Provider:   Kathyrn Sheriff PA-C   Other Instructions:  Heart Failure Education: Weigh yourself EVERY morning after you go to the bathroom but before you eat or drink anything. Write this number down in a weight log/diary. If you gain 3 pounds overnight or 5 pounds in a week, call the office. Take your medicines as prescribed. If you have concerns about your  medications, please call us before you stop taking them.  Eat low salt foods--Limit salt (sodium) to 2000 mg per day. This will help prevent your body from holding onto fluid. Read food labels as many processed foods have a lot of sodium, especially canned goods and prepackaged meats. If you would like some assistance choosing low sodium foods, we would be happy to set you up with a nutritionist. Stay as active as you can everyday. Staying active will give you more energy and make your muscles stronger. Start with 5 minutes at a time and work your way up to 30 minutes a day. Break up your activities--do some in the morning and some in the afternoon. Start with 3 days per week and work your way up to 5 days as you can.  If you have chest pain, feel short of breath, dizzy, or lightheaded, STOP. If you don't feel better after a short rest, call 911. If you do feel better, call the office to let us know you have  symptoms with exercise. Limit all fluids for the day to less than 2 liters. Fluid includes all drinks, coffee, juice, ice chips, soup, jello, and all other liquids.

## 2021-02-27 ENCOUNTER — Telehealth: Payer: Self-pay

## 2021-02-27 ENCOUNTER — Ambulatory Visit: Payer: Commercial Managed Care - PPO

## 2021-02-27 ENCOUNTER — Other Ambulatory Visit: Payer: Self-pay

## 2021-02-27 DIAGNOSIS — I10 Essential (primary) hypertension: Secondary | ICD-10-CM

## 2021-02-27 MED ORDER — BUMETANIDE 2 MG PO TABS: 2.0000 mg | ORAL_TABLET | Freq: Two times a day (BID) | ORAL | 0 refills | Status: DC

## 2021-02-27 MED ORDER — METOPROLOL SUCCINATE ER 25 MG PO TB24: 25.0000 mg | ORAL_TABLET | Freq: Every day | ORAL | 3 refills | Status: DC

## 2021-02-27 NOTE — Telephone Encounter (Signed)
Called patient to Advise Refill sent to pharmacy

## 2021-02-27 NOTE — Progress Notes (Deleted)
     02/27/2021 [name] Mar 19, 1971 [id number]   HPI:  Steve Conner is a 50 y.o. male patient of Dr Jens Som, with a PMH below who presents today for heart failure medication management.  He was seen in the office last week for worsening LE edema.  Prior to that he had not been seen since March of 2021.  At this appointment he stated his PCP had recently switched him from furosemide 40 mg bid to bumetanide  He noted at this appointment that he had gained 35 pounds in the previous week.    Past Medical History: hypertension   hyperlipidemia   hypothyroidism   GERD         Blood Pressure Goal:  130/80  Current Medications:  Family Hx:  Social Hx:   Diet:   Exercise:   Home BP readings:   Intolerances:   Labs:    Wt Readings from Last 3 Encounters:  02/22/21 274 lb 12.8 oz (124.6 kg)  02/15/20 239 lb 9.6 oz (108.7 kg)  09/14/19 233 lb (105.7 kg)   BP Readings from Last 3 Encounters:  02/22/21 126/70  02/15/20 (!) 153/93  09/14/19 135/90   Pulse Readings from Last 3 Encounters:  02/22/21 63  02/15/20 (!) 51  09/14/19 (!) 56    Current Outpatient Medications  Medication Sig Dispense Refill   amLODipine (NORVASC) 10 MG tablet Take 1 tablet (10 mg total) by mouth daily. 30 tablet 6   bumetanide (BUMEX) 2 MG tablet Take 1 tablet (2 mg total) by mouth 2 (two) times daily. 60 tablet 0   levothyroxine (SYNTHROID) 75 MCG tablet Take 1 tablet (75 mcg total) by mouth daily. 90 tablet 3   metoprolol succinate (TOPROL-XL) 25 MG 24 hr tablet Take 1 tablet (25 mg total) by mouth daily. Take with or immediately following a meal. 90 tablet 3   pantoprazole (PROTONIX) 20 MG tablet Take 1 tablet (20 mg total) by mouth daily. (NEEDS TO BE SEEN BEFORE NEXT REFILL) 30 tablet 0   potassium chloride SA (KLOR-CON) 20 MEQ tablet Take 1 tablet (20 mEq total) by mouth 2 (two) times daily. 180 tablet 1   rosuvastatin (CRESTOR) 40 MG tablet Take 1 tablet (40 mg total) by mouth daily. 90  tablet 3   Vitamin D, Ergocalciferol, (DRISDOL) 1.25 MG (50000 UT) CAPS capsule Take 1 capsule (50,000 Units total) by mouth every 7 (seven) days. 12 capsule 3   No current facility-administered medications for this visit.    Allergies  Allergen Reactions   Lisinopril Swelling   Penicillins Rash    Past Medical History:  Diagnosis Date   GERD (gastroesophageal reflux disease)    Hyperlipidemia    Hypertension    Hypothyroidism    Obesity (BMI 30.0-34.9) 10/24/2015    There were no vitals taken for this visit.  No problem-specific Assessment & Plan notes found for this encounter.   Phillips Hay PharmD CPP [facility] [address] [place], [place] [address] [phone]

## 2021-02-28 ENCOUNTER — Telehealth: Payer: Self-pay

## 2021-02-28 ENCOUNTER — Other Ambulatory Visit: Payer: Self-pay

## 2021-02-28 DIAGNOSIS — Z79899 Other long term (current) drug therapy: Secondary | ICD-10-CM

## 2021-02-28 LAB — BASIC METABOLIC PANEL
BUN/Creatinine Ratio: 14 (ref 9–20)
BUN: 18 mg/dL (ref 6–24)
CO2: 25 mmol/L (ref 20–29)
Calcium: 9.2 mg/dL (ref 8.7–10.2)
Chloride: 100 mmol/L (ref 96–106)
Creatinine, Ser: 1.25 mg/dL (ref 0.76–1.27)
Glucose: 109 mg/dL — ABNORMAL HIGH (ref 65–99)
Potassium: 3.3 mmol/L — ABNORMAL LOW (ref 3.5–5.2)
Sodium: 141 mmol/L (ref 134–144)
eGFR: 71 mL/min/{1.73_m2} (ref >59)

## 2021-02-28 NOTE — Telephone Encounter (Addendum)
Called patient with results from Judy Pimple, PA-C (see below). Patient stated that he had not started the increased Bumex due to his pharmacy just received the new Rx yesterday (02/27/21). I informed him that I would contact Dot Lanes and get back with him. Spoke with Judy Pimple, PA-C and she would like for patient to start the Bumex 2 mg BID and take potassium 40 meq (2 tablets) BID for 1 week and after 1 week to decrease Bumex to 2 mg daily and Potassium 40 meq (2 tablets) daily repeat BMET next Wednesday to keep daily weight log to call office if he has a weight gain of 3 lbs overnight and 5 lbs in a week. To follow up with her as scheduled. Called patient to inform him of the updated instructions. Patient verbalized understanding and asked that I send to his MyChart as well.      Beatriz Stallion, PA-C  02/28/2021  9:24 AM EDT     Please notify the patient that his potassium level is a little low, likely due to his increase in bumex. His kidney function is generally stable from 1 year ago. Please ask him to increase his potassium to 2 tablets by mouth 2 times per day until his bumex is further titrated (can update prescription if needed). I will plan to repeat his blood work at his follow-up visit with me 03/14/21. Please see if pharmacy can fit him in sometime in the next several days for further titration - doesn't appear that the planned 02/27/21 visit with pharmacy was ever scheduled. Thanks for your help!

## 2021-02-28 NOTE — Telephone Encounter (Signed)
Called and spoke to pt and was unable to coordinate an appt time. Pa-c kroger's medical assistant terrah came and was able to further instruct him on what to do until he sees kroger again. I will route to her

## 2021-03-02 ENCOUNTER — Telehealth (HOSPITAL_COMMUNITY): Payer: Self-pay | Admitting: Emergency Medicine

## 2021-03-02 NOTE — Telephone Encounter (Signed)
Reaching out to patient to offer assistance regarding upcoming cardiac imaging study; pt verbalizes understanding of appt date/time, parking situation and where to check in, pre-test NPO status and medications ordered, and verified current allergies; name and call back number provided for further questions should they arise Rockwell Alexandria RN Navigator Cardiac Imaging [facility] [phone] office [phone] cell   Hold bumex  Denies claustro Denies IV issues

## 2021-03-05 ENCOUNTER — Other Ambulatory Visit: Payer: Self-pay

## 2021-03-05 ENCOUNTER — Ambulatory Visit (HOSPITAL_COMMUNITY)
Admission: RE | Admit: 2021-03-05 | Discharge: 2021-03-05 | Disposition: A | Discharge status: Home / Self Care | Payer: Commercial Managed Care - PPO | Source: Ambulatory Visit | Attending: Medical | Admitting: Medical

## 2021-03-05 DIAGNOSIS — R072 Precordial pain: Secondary | ICD-10-CM

## 2021-03-05 MED ORDER — IOHEXOL 350 MG/ML SOLN
95.0000 mL | Freq: Once | INTRAVENOUS | Status: AC | PRN
  Administered 2021-03-05: 95 mL via INTRAVENOUS

## 2021-03-05 MED ORDER — NITROGLYCERIN 0.4 MG SL SUBL
0.8000 mg | SUBLINGUAL_TABLET | Freq: Once | SUBLINGUAL | Status: AC
  Administered 2021-03-05: 0.8 mg via SUBLINGUAL

## 2021-03-05 MED ORDER — NITROGLYCERIN 0.4 MG SL SUBL
SUBLINGUAL_TABLET | SUBLINGUAL | Status: AC
  Filled 2021-03-05: qty 2

## 2021-03-07 ENCOUNTER — Other Ambulatory Visit: Payer: Self-pay

## 2021-03-07 DIAGNOSIS — Z79899 Other long term (current) drug therapy: Secondary | ICD-10-CM

## 2021-03-08 LAB — BASIC METABOLIC PANEL
BUN/Creatinine Ratio: 16 (ref 9–20)
BUN: 23 mg/dL (ref 6–24)
CO2: 22 mmol/L (ref 20–29)
Calcium: 8.9 mg/dL (ref 8.7–10.2)
Chloride: 99 mmol/L (ref 96–106)
Creatinine, Ser: 1.47 mg/dL — ABNORMAL HIGH (ref 0.76–1.27)
Glucose: 104 mg/dL — ABNORMAL HIGH (ref 65–99)
Potassium: 3.3 mmol/L — ABNORMAL LOW (ref 3.5–5.2)
Sodium: 140 mmol/L (ref 134–144)
eGFR: 59 mL/min/{1.73_m2} — ABNORMAL LOW (ref >59)

## 2021-03-11 ENCOUNTER — Other Ambulatory Visit: Payer: Self-pay | Admitting: Family

## 2021-03-13 ENCOUNTER — Ambulatory Visit (HOSPITAL_COMMUNITY): Payer: Commercial Managed Care - PPO | Attending: Cardiovascular Disease

## 2021-03-13 ENCOUNTER — Other Ambulatory Visit: Payer: Self-pay

## 2021-03-13 DIAGNOSIS — E781 Pure hyperglyceridemia: Secondary | ICD-10-CM | POA: Diagnosis present

## 2021-03-13 DIAGNOSIS — E669 Obesity, unspecified: Secondary | ICD-10-CM | POA: Diagnosis present

## 2021-03-13 DIAGNOSIS — I10 Essential (primary) hypertension: Secondary | ICD-10-CM | POA: Diagnosis not present

## 2021-03-13 DIAGNOSIS — R600 Localized edema: Secondary | ICD-10-CM | POA: Diagnosis not present

## 2021-03-13 DIAGNOSIS — R072 Precordial pain: Secondary | ICD-10-CM | POA: Diagnosis not present

## 2021-03-13 DIAGNOSIS — E785 Hyperlipidemia, unspecified: Secondary | ICD-10-CM | POA: Diagnosis present

## 2021-03-13 LAB — ECHOCARDIOGRAM COMPLETE
Area-P 1/2: 3.12 cm2
S' Lateral: 3.3 cm

## 2021-03-13 MED ORDER — PERFLUTREN LIPID MICROSPHERE
1.0000 mL | INTRAVENOUS | Status: AC | PRN
Start: 2021-03-13 — End: 2021-03-13
  Administered 2021-03-13: 3 mL via INTRAVENOUS

## 2021-03-14 ENCOUNTER — Encounter: Payer: Self-pay | Admitting: Medical

## 2021-03-14 ENCOUNTER — Ambulatory Visit: Payer: Commercial Managed Care - PPO | Admitting: Medical

## 2021-03-14 VITALS — BP 120/78 | HR 59 | Ht 72.0 in | Wt 265.5 lb

## 2021-03-14 DIAGNOSIS — I10 Essential (primary) hypertension: Secondary | ICD-10-CM | POA: Diagnosis not present

## 2021-03-14 DIAGNOSIS — E785 Hyperlipidemia, unspecified: Secondary | ICD-10-CM

## 2021-03-14 DIAGNOSIS — G441 Vascular headache, not elsewhere classified: Secondary | ICD-10-CM

## 2021-03-14 DIAGNOSIS — R600 Localized edema: Secondary | ICD-10-CM | POA: Diagnosis not present

## 2021-03-14 DIAGNOSIS — Z8249 Family history of ischemic heart disease and other diseases of the circulatory system: Secondary | ICD-10-CM | POA: Diagnosis not present

## 2021-03-14 DIAGNOSIS — Z79899 Other long term (current) drug therapy: Secondary | ICD-10-CM

## 2021-03-14 MED ORDER — CARVEDILOL 6.25 MG PO TABS: 6.2500 mg | ORAL_TABLET | Freq: Two times a day (BID) | ORAL | 3 refills | Status: DC

## 2021-03-14 MED ORDER — AMLODIPINE BESYLATE 5 MG PO TABS: 5.0000 mg | ORAL_TABLET | Freq: Every day | ORAL | 3 refills | Status: DC

## 2021-03-14 NOTE — Patient Instructions (Signed)
Medication Instructions:  STOP METOPROLOL  START CARVEDILOL 6.25 TWICE DAILY  DECREASE AMLODIPINE  *If you need a refill on your cardiac medications before your next appointment, please call your pharmacy*  Lab Work:    BMET, BNP AND FASTING LIPID TODAY      Special Instructions Your physician has requested that you have a carotid duplex. This test is an ultrasound of the carotid arteries in your neck. It looks at blood flow through these arteries that supply the brain with blood. Allow one hour for this exam. There are no restrictions or special instructions.  TAKE AND LOG YOUR BLOOD PRESSURE >130/80  Follow-Up: Your next appointment:  IN [place]  In Person with Olga Millers, MD OR Judy Pimple, PA-C  At [facility], you and your health needs are our priority.  As part of our continuing mission to provide you with exceptional heart care, we have created designated Provider Care Teams.  These Care Teams include your primary Cardiologist (physician) and Advanced Practice Providers (APPs -  Physician Assistants and Nurse Practitioners) who all work together to provide you with the care you need, when you need it.

## 2021-03-14 NOTE — Progress Notes (Signed)
Cardiology Office Note   Date:  03/14/2021   ID:  Steve Conner, DOB 1970/08/14, MRN [id number]  PCP:  Rebekah Chesterfield, NP  Cardiologist:  Olga Millers, MD EP: None  Chief Complaint  Patient presents with   Follow-up    LE edema       History of Present Illness: Steve Conner is a 50 y.o. male with a  PMH of HTN, HLD, hypothyroidism, LE edema, and GERD, who presents for follow-up of his LE edema.   At his last visit with myself 8/11/2 he had complaints of LE edema. He gained 35lbs since 02/14/21. He was seen by his PCP (recently changed providers) the  month prior and his lasix was transition from 40mg  BID to bumex 1mg  daily. Since that time he continued to have worsening edema. He had some DOE which was a longstanding issue but no SOB at rest. No complaints of orthopnea or PND. He reported intermittent chest pressure once a week which last for 20-30 minutes but is not exertional in nature. He reports episodes of palpitations several times a week lasting for an hour before resolving spontaneously. He has no complaints of dizziness, lightheadedness, or syncope. He reports eating out frequently as he lives alone. We discussed the importance of maintaining a low salt diet. He reports mother has history of CAD s/p CABG in her 24s, father died of an aneurysm at 77. He was recommended to take bumex 2mg  BID x7 week, then reduce to 2mg  daily going forward. There was some initial confusion with regards to starting this and unfortunately he continued to gain some weight following his visit with delay in transition to higher dose of bumex.   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 He had an echocardiogram 03/13/21 which showed EF 65-70%, normal LV diastolic function, no RWMA, and no significant valvular abnormalities.   He presents today for  follow-up. His LE edema is improving. He has continued to lose weight from 274lbs down to 257lbs on home scale this morning. He notes improvement in his energy level. No recurrent chest pain. No complaints of SOB, orthopnea, PND, dizziness, lightheadedness, or syncope. Palpitations have improved as well with weight loss. He completed his cardiac monitor but has not mailed it back yet. He has been working to cut back on salt intake. Encouraged healthy dietary/lifestyle modifications to help with weight loss and avoid fluid buildup. We reviewed his recent coronary CTA and echocardiogram results in detail. His only new complaints is issues with an odd HA and fatigue sensation in his head when rasing his arms - notably when he has his hands on the upper part of a steering wheel or on the handlebars of his motorcycle and goes away when he lowers his arm.        Past Medical History:  Diagnosis Date   GERD (gastroesophageal reflux disease)    Hyperlipidemia    Hypertension    Hypothyroidism    Obesity (BMI 30.0-34.9) 10/24/2015    Past Surgical History:  Procedure Laterality Date   No prior surgery       Current Outpatient Medications  Medication Sig Dispense Refill   bumetanide (BUMEX) 2 MG tablet Take 1 tablet (2 mg total) by mouth 2 (two) times daily. 60 tablet 0   carvedilol (COREG) 6.25 MG tablet Take 1 tablet (6.25  mg total) by mouth 2 (two) times daily. 180 tablet 3   levothyroxine (SYNTHROID) 75 MCG tablet Take 1 tablet (75 mcg total) by mouth daily. 90 tablet 3   pantoprazole (PROTONIX) 20 MG tablet Take 1 tablet (20 mg total) by mouth daily. (NEEDS TO BE SEEN BEFORE NEXT REFILL) 30 tablet 0   potassium chloride SA (KLOR-CON) 20 MEQ tablet Take 1 tablet (20 mEq total) by mouth 2 (two) times daily. 180 tablet 1   rosuvastatin (CRESTOR) 40 MG tablet Take 1 tablet (40 mg total) by mouth daily. 90 tablet 3   Vitamin D, Ergocalciferol, (DRISDOL) 1.25 MG (50000 UT) CAPS capsule Take 1 capsule  (50,000 Units total) by mouth every 7 (seven) days. 12 capsule 3   amLODipine (NORVASC) 5 MG tablet Take 1 tablet (5 mg total) by mouth daily. 30 tablet 3   No current facility-administered medications for this visit.    Allergies:   Lisinopril and Penicillins    Social History:  The patient  reports that he has never smoked. He has never used smokeless tobacco. He reports that he does not drink alcohol and does not use drugs.   Family History:  The patient's family history includes Aneurysm (age of onset: 85) in his father; Cancer in his mother; Diabetes in his mother; Heart disease (age of onset: 49) in his mother.    ROS:  Please see the history of present illness.   Otherwise, review of systems are positive for none.   All other systems are reviewed and negative.    PHYSICAL EXAM: VS:  BP 120/78 (BP Location: Right Arm, Patient Position: Sitting, Cuff Size: Normal)   Pulse (!) 59   Ht 6' (1.829 m)   Wt 265 lb 8 oz (120.4 kg)   SpO2 97%   BMI 36.01 kg/m  , BMI Body mass index is 36.01 kg/m. GEN: Well nourished, well developed, in no acute distress HEENT: sclera anicteric Neck: no JVD, carotid bruits, or masses Cardiac: RRR; no murmurs, rubs, or gallops, 1+ LE edema  Respiratory:  clear to auscultation bilaterally, normal work of breathing GI: soft, nontender, nondistended, + BS MS: no deformity or atrophy Skin: warm and dry, no rash Neuro:  Strength and sensation are intact Psych: euthymic mood, full affect   EKG:  EKG is not ordered today.   Recent Labs: 03/07/2021: BUN 23; Creatinine, Ser 1.47; Potassium 3.3; Sodium 140    Lipid Panel    Component Value Date/Time   CHOL 199 02/15/2020 1537   TRIG 548 (H) 02/15/2020 1537   HDL 27 (L) 02/15/2020 1537   CHOLHDL 7.4 (H) 02/15/2020 1537   LDLCALC 83 02/15/2020 1537      Wt Readings from Last 3 Encounters:  03/14/21 265 lb 8 oz (120.4 kg)  02/22/21 274 lb 12.8 oz (124.6 kg)  02/15/20 239 lb 9.6 oz (108.7 kg)       Other studies Reviewed: Additional studies/ records that were reviewed today include:   Echocardiogram 03/13/21: 1. Left ventricular ejection fraction, by estimation, is 65 to 70%. The  left ventricle has normal function. The left ventricle has no regional  wall motion abnormalities. Left ventricular diastolic parameters were  normal.   2. Right ventricular systolic function is normal. The right ventricular  size is normal.   3. The mitral valve is normal in structure. No evidence of mitral valve  regurgitation. No evidence of mitral stenosis.   4. The aortic valve is normal in structure. Aortic valve regurgitation is  not visualized. No aortic stenosis is present.   Coronary CTA 03/05/21: FINDINGS: Non-cardiac: See separate report from [facility].   Pulmonary veins drain normally to the left atrium. No LA appendage thrombus.   Calcium Score: 55 Agatston units.   Coronary Arteries: Right dominant with no anomalies   LM: No plaque or stenosis.   LAD system: Mixed plaque proximal LAD, minimal stenosis. 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   Circumflex system: Moderate high OM1, small AV LCx. No plaque or stenosis.   RCA system: Mixed plaque proximal RCA, minimal stenosis.   IMPRESSION: 1. Coronary artery calcium score 55 Agatston units. This places the patient in the 89th percentile for age and gender, suggesting high risk for future cardiac events.   2.  Mild, nonobstructive CAD.  ASSESSMENT AND PLAN:  1. Acute on chronic diastolic CHF: Continues to improve on bumex 2mg  daily following 2mg  BID course x1 week. Weight is down to 256lbs on home scale this morning. Recent echo very reassuring with normal LV function. No SOB and lungs are clear on exam. He has 1+ LE edema today.  - Continue bumex 2mg  daily - Will decrease amlodipine to 5mg  daily as this may be contributing to his LE edema - Will update a BNP and BMET today for  close monitoring of electrolytes and kidney function - Encouraged low salt diet. Salty 6 given.  - Patient encouraged to continue monitoring daily weights    2. Mild non-obstructive CAD: mild LAD/RCA stenosis noted on coronary CTA this month. CP has improved with improvement in volume status - Continue aggressive risk factor modifications to promote goal BP <130/80, LDL <70, and A1C <7  3. Palpitations: overall improved. He completed his heart monitor but has not mailed it back yet - Instructed patient to return monitor - will follow-up results - Will transition to carvedilol and stop metoprolol   3. HTN: BP 120/78 today  - Will decrease amlodipine to 5mg  daily to help with LE edema - Will transition from metoprolol to carvedilol 6.25mg  BID to compensate for BP control   4. HLD: LDL 83 and triglycerides 548 (improved from 98 10/2018).  - Continue crestor to 40mg  daily - Will repeat FLP today - if tryglycerides not at goal on next FLP, would consider addition of fenofibrate or vascepa    5. Obesity: BMI 37  - Continue to encourage dietary/lifestyle modifications to promote weight loss   6. Headache with raising arms: no carotid bruit on exam - Will check carotid doppler to f/o obstruction   Current medicines are reviewed at length with the patient today.  The patient does not have concerns regarding medicines.  The following changes have been made:  As above  Labs/ tests ordered today include:   Orders Placed This Encounter  Procedures   Basic metabolic panel   Brain natriuretic peptide   Lipid Profile   VAS CAROTID      Disposition:   FU with Dr. in 4 months  Signed, , PA-C  03/14/2021 1:27 PM

## 2021-03-15 LAB — BASIC METABOLIC PANEL
BUN/Creatinine Ratio: 12 (ref 9–20)
BUN: 18 mg/dL (ref 6–24)
CO2: 23 mmol/L (ref 20–29)
Calcium: 8.9 mg/dL (ref 8.7–10.2)
Chloride: 99 mmol/L (ref 96–106)
Creatinine, Ser: 1.5 mg/dL — ABNORMAL HIGH (ref 0.76–1.27)
Glucose: 94 mg/dL (ref 65–99)
Potassium: 3.2 mmol/L — ABNORMAL LOW (ref 3.5–5.2)
Sodium: 142 mmol/L (ref 134–144)
eGFR: 57 mL/min/{1.73_m2} — ABNORMAL LOW (ref >59)

## 2021-03-15 LAB — LIPID PANEL
Chol/HDL Ratio: 6.4 ratio — ABNORMAL HIGH (ref 0.0–5.0)
Cholesterol, Total: 147 mg/dL (ref 100–199)
HDL: 23 mg/dL — ABNORMAL LOW (ref >39)
LDL Chol Calc (NIH): 52 mg/dL (ref 0–99)
Triglycerides: 480 mg/dL — ABNORMAL HIGH (ref 0–149)
VLDL Cholesterol Cal: 72 mg/dL — ABNORMAL HIGH (ref 5–40)

## 2021-03-15 LAB — BRAIN NATRIURETIC PEPTIDE: BNP: 6.8 pg/mL (ref 0.0–100.0)

## 2021-03-22 ENCOUNTER — Other Ambulatory Visit: Payer: Self-pay

## 2021-03-22 ENCOUNTER — Ambulatory Visit (HOSPITAL_COMMUNITY)
Admission: RE | Admit: 2021-03-22 | Discharge: 2021-03-22 | Disposition: A | Discharge status: Home / Self Care | Payer: Commercial Managed Care - PPO | Source: Ambulatory Visit | Attending: Internal Medicine | Admitting: Internal Medicine

## 2021-03-22 DIAGNOSIS — G441 Vascular headache, not elsewhere classified: Secondary | ICD-10-CM | POA: Insufficient documentation

## 2021-03-23 ENCOUNTER — Other Ambulatory Visit: Payer: Self-pay | Admitting: Medical

## 2021-03-23 DIAGNOSIS — K118 Other diseases of salivary glands: Secondary | ICD-10-CM

## 2021-03-24 ENCOUNTER — Other Ambulatory Visit: Payer: Self-pay | Admitting: Medical

## 2021-03-30 ENCOUNTER — Ambulatory Visit (HOSPITAL_COMMUNITY): Payer: Commercial Managed Care - PPO

## 2021-04-03 ENCOUNTER — Ambulatory Visit (HOSPITAL_COMMUNITY)
Admission: RE | Admit: 2021-04-03 | Discharge: 2021-04-03 | Disposition: A | Discharge status: Home / Self Care | Payer: Commercial Managed Care - PPO | Source: Ambulatory Visit | Attending: Medical | Admitting: Medical

## 2021-04-03 ENCOUNTER — Other Ambulatory Visit: Payer: Self-pay

## 2021-04-03 DIAGNOSIS — K118 Other diseases of salivary glands: Secondary | ICD-10-CM | POA: Diagnosis not present

## 2021-04-04 ENCOUNTER — Other Ambulatory Visit: Payer: Self-pay | Admitting: Medical

## 2021-04-04 DIAGNOSIS — K118 Other diseases of salivary glands: Secondary | ICD-10-CM

## 2021-04-11 ENCOUNTER — Other Ambulatory Visit: Payer: Self-pay | Admitting: Medical

## 2021-07-19 NOTE — Progress Notes (Deleted)
HPI: FU hypertension. CTA August 2022 showed calcium score 55 which was 89th percentile and mild nonobstructive coronary disease.  Echocardiogram August 2022 showed normal LV function.  Carotid Dopplers September 2022 showed near normal right and normal left carotid; there was note of parotid mass on the left and dedicated head/neck ultrasound recommended.  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  Follow-up with ENT recommended.  Since last seen   Current Outpatient Medications  Medication Sig Dispense Refill   amLODipine (NORVASC) 5 MG tablet Take 1 tablet (5 mg total) by mouth daily. 30 tablet 3   bumetanide (BUMEX) 2 MG tablet TAKE 1 TABLET BY MOUTH 2 TIMES DAILY. 60 tablet 3   carvedilol (COREG) 6.25 MG tablet Take 1 tablet (6.25 mg total) by mouth 2 (two) times daily. 180 tablet 3   levothyroxine (SYNTHROID) 75 MCG tablet Take 1 tablet (75 mcg total) by mouth daily. 90 tablet 3   pantoprazole (PROTONIX) 20 MG tablet Take 1 tablet (20 mg total) by mouth daily. (NEEDS TO BE SEEN BEFORE NEXT REFILL) 30 tablet 0   potassium chloride SA (KLOR-CON) 20 MEQ tablet Take 1 tablet (20 mEq total) by mouth 2 (two) times daily. 180 tablet 1   rosuvastatin (CRESTOR) 40 MG tablet Take 1 tablet (40 mg total) by mouth daily. 90 tablet 3   Vitamin D, Ergocalciferol, (DRISDOL) 1.25 MG (50000 UT) CAPS capsule Take 1 capsule (50,000 Units total) by mouth every 7 (seven) days. 12 capsule 3   No current facility-administered medications for this visit.     Past Medical History:  Diagnosis Date   GERD (gastroesophageal reflux disease)    Hyperlipidemia    Hypertension    Hypothyroidism    Obesity (BMI 30.0-34.9) 10/24/2015    Past Surgical History:  Procedure Laterality Date   No prior surgery      Social History   Socioeconomic History   Marital status: Divorced    Spouse name: Not on file   Number of children: 2    Years of education: Not on file   Highest education level: Not on file  Occupational History   Not on file  Tobacco Use   Smoking status: Never   Smokeless tobacco: Never  Vaping Use   Vaping Use: Never used  Substance and Sexual Activity   Alcohol use: No   Drug use: No   Sexual activity: Not on file  Other Topics Concern   Not on file  Social History Narrative   Not on file   Social Determinants of Health   Financial Resource Strain: Not on file  Food Insecurity: Not on file  Transportation Needs: Not on file  Physical Activity: Not on file  Stress: Not on file  Social Connections: Not on file  Intimate Partner Violence: Not on file    Family History  Problem Relation Age of Onset   Heart disease Mother 45   Diabetes Mother    Cancer Mother    Aneurysm Father 40    ROS: no fevers or chills, productive cough, hemoptysis, dysphasia, odynophagia, melena, hematochezia, dysuria, hematuria, rash, seizure activity, orthopnea, PND, pedal edema, claudication. Remaining systems are negative.  Physical Exam: Well-developed well-nourished in no acute distress.  Skin is warm and dry.  HEENT is normal.  Neck is supple.  Chest is clear to auscultation with normal expansion.  Cardiovascular exam is regular rate and rhythm.  Abdominal exam nontender  or distended. No masses palpated. Extremities show no edema. neuro grossly intact  ECG- personally reviewed  A/P  1 coronary artery disease-mild on previous CTA; continue medical therapy with aspirin and statin.  2 hypertension-blood pressure controlled.  Continue present medical regimen.  3 hyperlipidemia-continue statin.  4 lower extremity edema-continue diuretic at present dose.  5 palpitations-symptoms improved.  Continue carvedilol.  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  Kirk Ruths, MD

## 2021-08-01 ENCOUNTER — Ambulatory Visit: Payer: Commercial Managed Care - PPO | Admitting: Cardiology

## 2021-08-08 ENCOUNTER — Other Ambulatory Visit: Payer: Self-pay | Admitting: Medical

## 2021-08-08 DIAGNOSIS — I10 Essential (primary) hypertension: Secondary | ICD-10-CM

## 2021-10-25 ENCOUNTER — Other Ambulatory Visit: Payer: Self-pay | Admitting: Medical

## 2021-11-13 ENCOUNTER — Other Ambulatory Visit: Payer: Self-pay | Admitting: Medical

## 2021-11-13 DIAGNOSIS — I10 Essential (primary) hypertension: Secondary | ICD-10-CM

## 2021-11-30 NOTE — Progress Notes (Unsigned)
HPI: FU hypertension. Coronary CTA August 2022 showed calcium score 55 which was 89th percentile and mild nonobstructive coronary disease.  Echocardiogram August 2022 showed normal LV function.  Carotid Doppler September 2022 showed near normal bilaterally.  There was a mass in the right parotid.  Follow-up ultrasound showed hypoechoic mass in the right parotid gland and follow-up CT or biopsy recommended.  Patient referred to ENT.  Since last seen   Current Outpatient Medications  Medication Sig Dispense Refill   amLODipine (NORVASC) 5 MG tablet Take 1 tablet (5 mg total) by mouth daily. PATIENT MUST SCHEDULE ANNUAL APPOINTMENT FOR FUTURE REFILLS. 90 tablet 0   bumetanide (BUMEX) 2 MG tablet TAKE 1 TABLET BY MOUTH 2 TIMES DAILY. 60 tablet 3   carvedilol (COREG) 6.25 MG tablet Take 1 tablet (6.25 mg total) by mouth 2 (two) times daily. 180 tablet 3   KLOR-CON M20 20 MEQ tablet TAKE 1 TABLET BY MOUTH TWICE A DAY 180 tablet 1   levothyroxine (SYNTHROID) 75 MCG tablet Take 1 tablet (75 mcg total) by mouth daily. 90 tablet 3   pantoprazole (PROTONIX) 20 MG tablet Take 1 tablet (20 mg total) by mouth daily. (NEEDS TO BE SEEN BEFORE NEXT REFILL) 30 tablet 0   rosuvastatin (CRESTOR) 40 MG tablet Take 1 tablet (40 mg total) by mouth daily. 90 tablet 3   Vitamin D, Ergocalciferol, (DRISDOL) 1.25 MG (50000 UT) CAPS capsule Take 1 capsule (50,000 Units total) by mouth every 7 (seven) days. 12 capsule 3   No current facility-administered medications for this visit.     Past Medical History:  Diagnosis Date   GERD (gastroesophageal reflux disease)    Hyperlipidemia    Hypertension    Hypothyroidism    Obesity (BMI 30.0-34.9) 10/24/2015    Past Surgical History:  Procedure Laterality Date   No prior surgery      Social History   Socioeconomic History   Marital status: Divorced    Spouse name: Not on file   Number of children: 2   Years of education: Not on file   Highest education  level: Not on file  Occupational History   Not on file  Tobacco Use   Smoking status: Never   Smokeless tobacco: Never  Vaping Use   Vaping Use: Never used  Substance and Sexual Activity   Alcohol use: No   Drug use: No   Sexual activity: Not on file  Other Topics Concern   Not on file  Social History Narrative   Not on file   Social Determinants of Health   Financial Resource Strain: Not on file  Food Insecurity: Not on file  Transportation Needs: Not on file  Physical Activity: Not on file  Stress: Not on file  Social Connections: Not on file  Intimate Partner Violence: Not on file    Family History  Problem Relation Age of Onset   Heart disease Mother 33   Diabetes Mother    Cancer Mother    Aneurysm Father 40    ROS: no fevers or chills, productive cough, hemoptysis, dysphasia, odynophagia, melena, hematochezia, dysuria, hematuria, rash, seizure activity, orthopnea, PND, pedal edema, claudication. Remaining systems are negative.  Physical Exam: Well-developed well-nourished in no acute distress.  Skin is warm and dry.  HEENT is normal.  Neck is supple.  Chest is clear to auscultation with normal expansion.  Cardiovascular exam is regular rate and rhythm.  Abdominal exam nontender or distended. No masses palpated. Extremities show  no edema. neuro grossly intact  ECG- personally reviewed  A/P  1 coronary artery disease-based on elevated calcium score.  2 hypertension-  3 hyperlipidemia-  4 lower extremity edema-  5 palpitations-  6 parotid mass-    Olga Millers, MD

## 2021-12-06 ENCOUNTER — Ambulatory Visit: Payer: BC Managed Care – PPO | Admitting: Cardiology

## 2021-12-11 ENCOUNTER — Encounter: Payer: Self-pay | Admitting: Cardiology

## 2022-01-17 ENCOUNTER — Other Ambulatory Visit: Payer: Self-pay | Admitting: Medical

## 2022-01-17 NOTE — Telephone Encounter (Signed)
This is Dr. Crenshaw's pt 

## 2022-04-20 ENCOUNTER — Other Ambulatory Visit: Payer: Self-pay | Admitting: Cardiology

## 2022-04-20 ENCOUNTER — Other Ambulatory Visit: Payer: Self-pay | Admitting: Medical

## 2023-02-28 IMAGING — CT CT HEART MORP W/ CTA COR W/ SCORE W/ CA W/CM &/OR W/O CM
4 of 7 series · 8 of 20 positions shown, 9 images · non-contrast
Comparison: None.
COMPARISON: None.

Addendum:
EXAM:
OVER-READ INTERPRETATION  CT CHEST

The following report is an over-read performed by radiologist Dr.
Kyoungwoo Vilar Rodrigues [[email or web address]] on 03/05/2021. This over-read
does not include interpretation of cardiac or coronary anatomy or
pathology. The coronary CTA interpretation by the cardiologist is
attached.
CLINICAL DATA: Chest pain
Cardiac CTA
MEDICATIONS:
Sub lingual nitro. 4mg x 2
TECHNIQUE: The patient was scanned on a Siemens [REDACTED]ice scanner. Gantry
rotation speed was 250 msecs. Collimation was 0.6 mm. A 100 kV
prospective scan was triggered in the ascending thoracic aorta at
35-75% of the R-R interval. Average HR during the scan was 60 bpm.
The 3D data set was interpreted on a dedicated work station using
MPR, MIP and VRT modes. A total of 80cc of contrast was used.

[Series 8: best diast 73 % · axial · 0.39mm/px · z∈[+1204,+1245]mm · 2 of 308 slices shown]
[im 103/308  vessel]
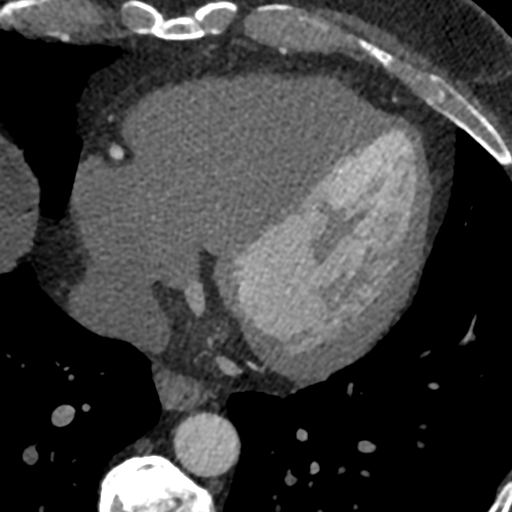
[im 205/308  vessel]
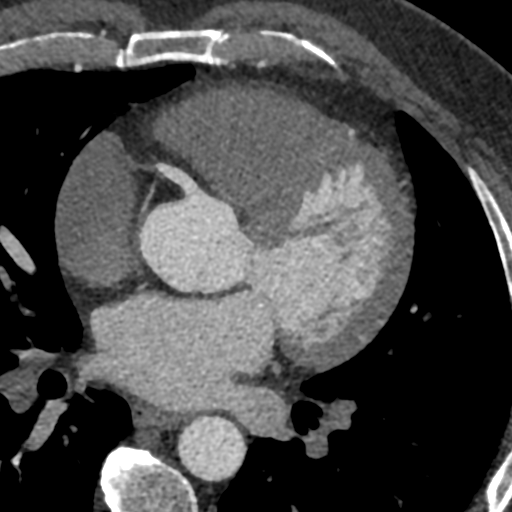

[Series 9: best syst · axial · 0.39mm/px · z∈[+1204,+1245]mm · 2 of 308 slices shown, 3 images]
[im 103/308  vessel]
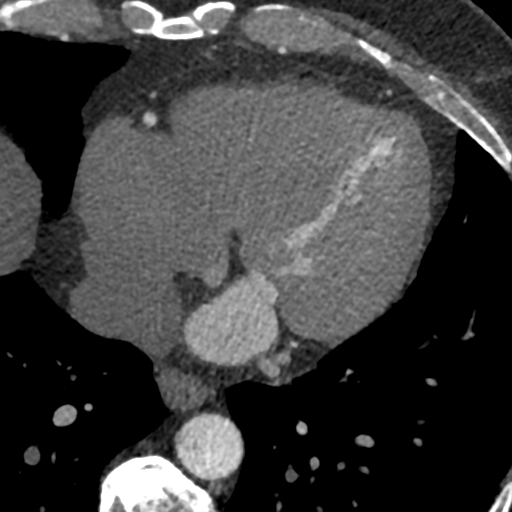
[im 103/308  lung]
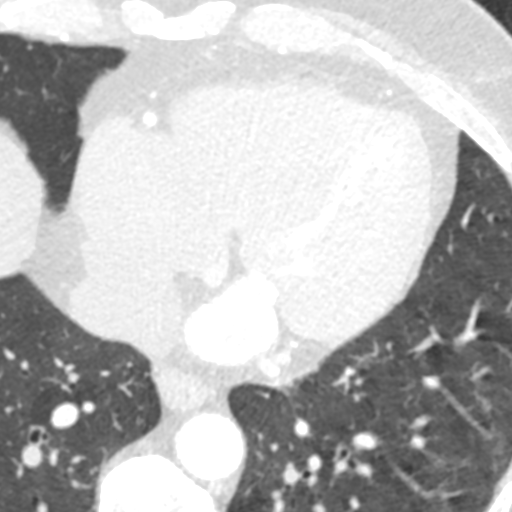
[im 205/308  vessel]
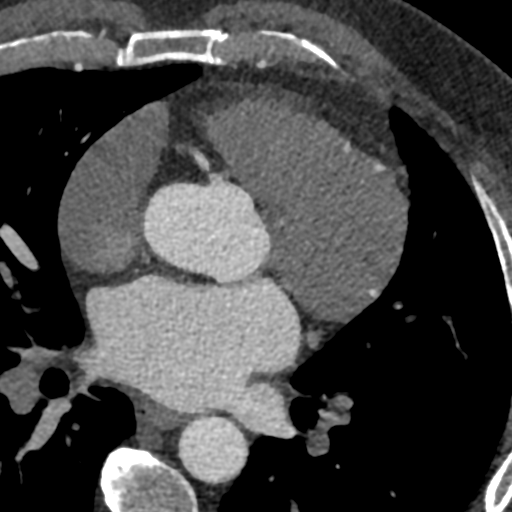

[Series 10: ts diast sharp 73 % · axial · 0.39mm/px · z∈[+1204,+1245]mm · 2 of 308 slices shown]
[im 103/308  lung]
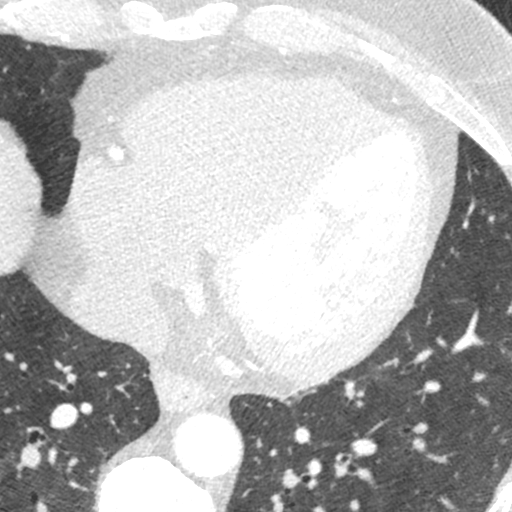
[im 205/308  lung]
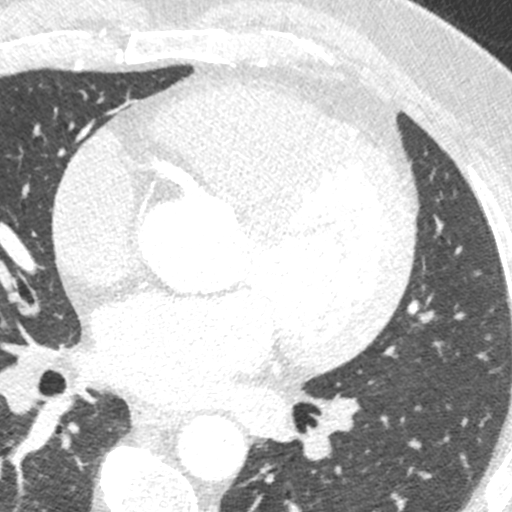

[Series 11: ts syst sharp · axial · 0.39mm/px · z∈[+1204,+1245]mm · 2 of 308 slices shown]
[im 103/308  lung]
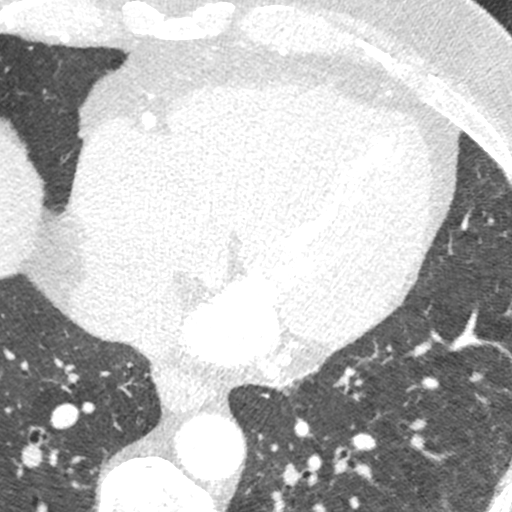
[im 205/308  lung]
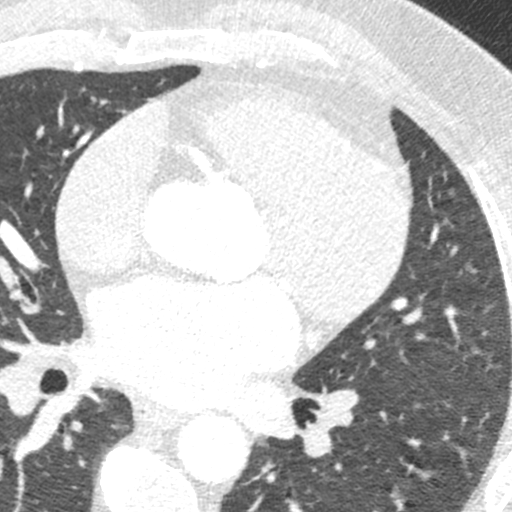

[8 of 20 positions shown; findings below may reference images not displayed]

FINDINGS: Vascular: Heart is normal size. Aorta is normal caliber. Aorta
normal caliber.

Mediastinum/Nodes: No mediastinal, hilar, or axillary adenopathy.

Lungs/Pleura: Visualized lungs clear.  No effusions.

Upper Abdomen: Imaging into the upper abdomen demonstrates no acute
findings.

Musculoskeletal: Chest wall soft tissues are unremarkable. No acute
bony abnormality.
IMPRESSION: No acute or significant extracardiac abnormality.
FINDINGS: Non-cardiac: See separate report from [REDACTED].

Pulmonary veins drain normally to the left atrium. No LA appendage
thrombus.

Calcium Score: 55 Agatston units.

Coronary Arteries: Right dominant with no anomalies

LM: No plaque or stenosis.

LAD system: Mixed plaque proximal LAD, minimal stenosis. There was a
segment of shallow myocardial bridging in the mid LAD.

Circumflex system: Moderate high OM1, small AV LCx. No plaque or
stenosis.

RCA system: Mixed plaque proximal RCA, minimal stenosis.
IMPRESSION: 1. Coronary artery calcium score 55 Agatston units. This places the
patient in the 89th percentile for age and gender, suggesting high
risk for future cardiac events.

2.  Mild, nonobstructive CAD.

Paulus N Ceejay

*** End of Addendum ***
EXAM:
OVER-READ INTERPRETATION  CT CHEST

The following report is an over-read performed by radiologist Dr.
Kyoungwoo Vilar Rodrigues [[email or web address]] on 03/05/2021. This over-read
does not include interpretation of cardiac or coronary anatomy or
pathology. The coronary CTA interpretation by the cardiologist is
attached.
FINDINGS: Vascular: Heart is normal size. Aorta is normal caliber. Aorta
normal caliber.

Mediastinum/Nodes: No mediastinal, hilar, or axillary adenopathy.

Lungs/Pleura: Visualized lungs clear.  No effusions.

Upper Abdomen: Imaging into the upper abdomen demonstrates no acute
findings.

Musculoskeletal: Chest wall soft tissues are unremarkable. No acute
bony abnormality.
IMPRESSION: No acute or significant extracardiac abnormality.

## 2023-03-29 IMAGING — US US SOFT TISSUE HEAD/NECK
1 series · 7 of 7 positions shown · non-contrast
Comparison: None.

CLINICAL DATA: Initial evaluation for parotid mass.

EXAM:
ULTRASOUND OF HEAD/NECK SOFT TISSUES
TECHNIQUE: Ultrasound examination of the head and neck soft tissues was
performed in the area of clinical concern.

[Series 1: us soft tissue head/neck mc & wl · 7 acquisitions, 7 frames shown]
[im 1/7]
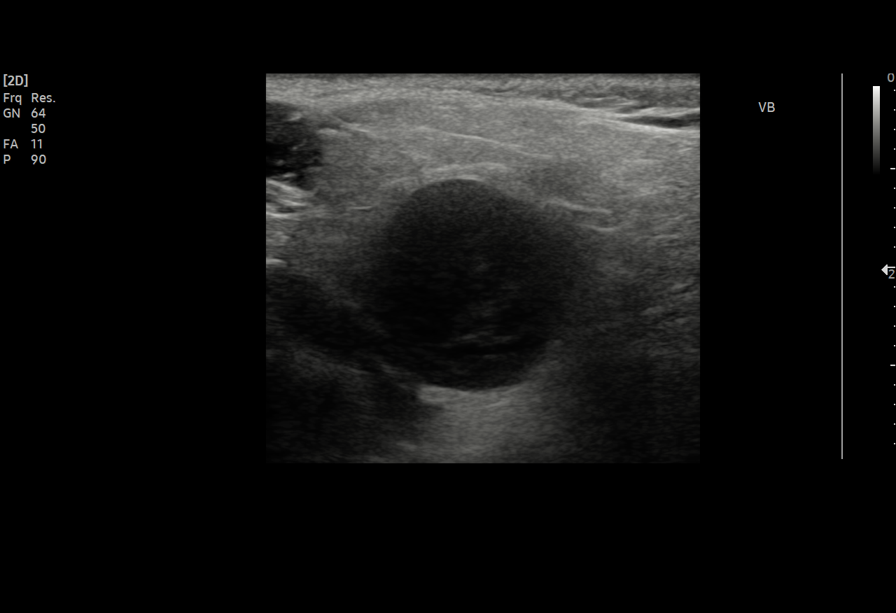
[im 2/7]
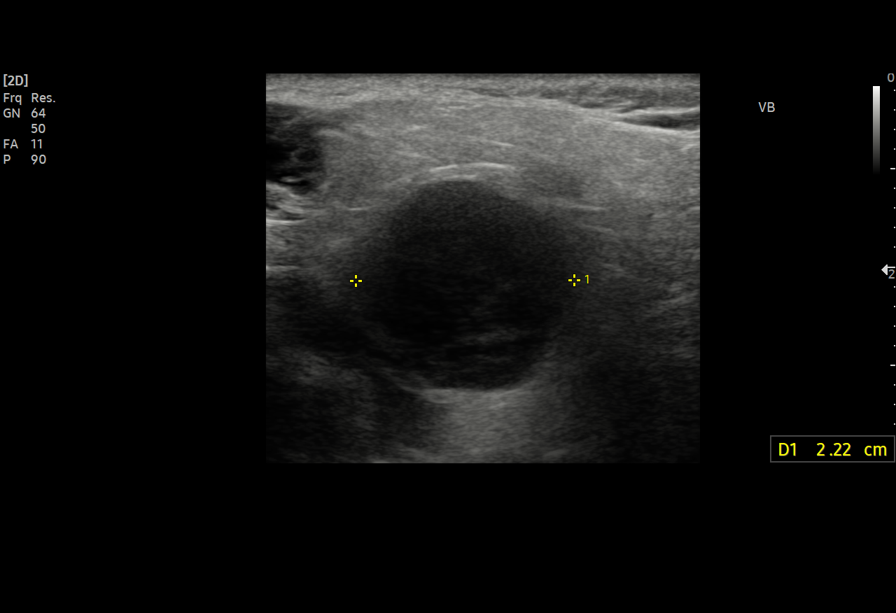
[im 3/7]
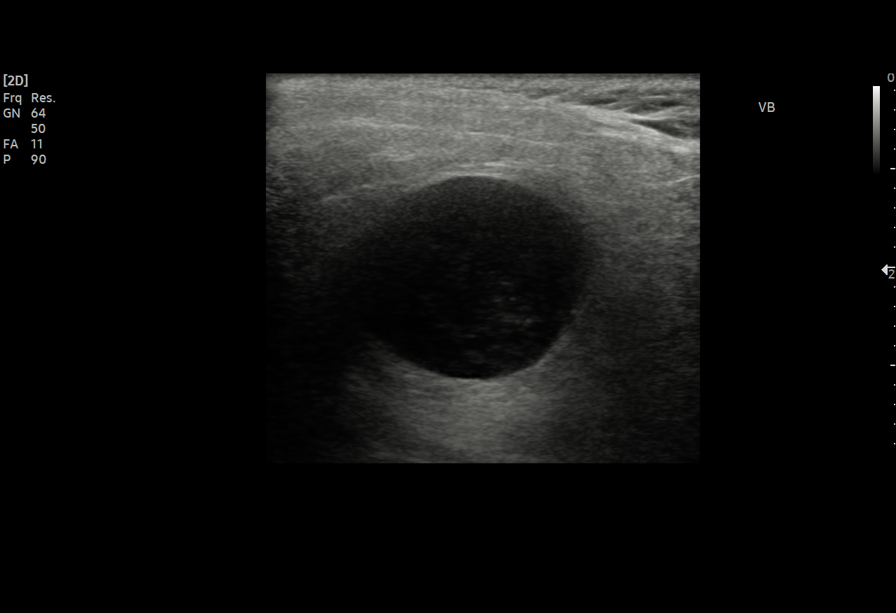
[im 4/7]
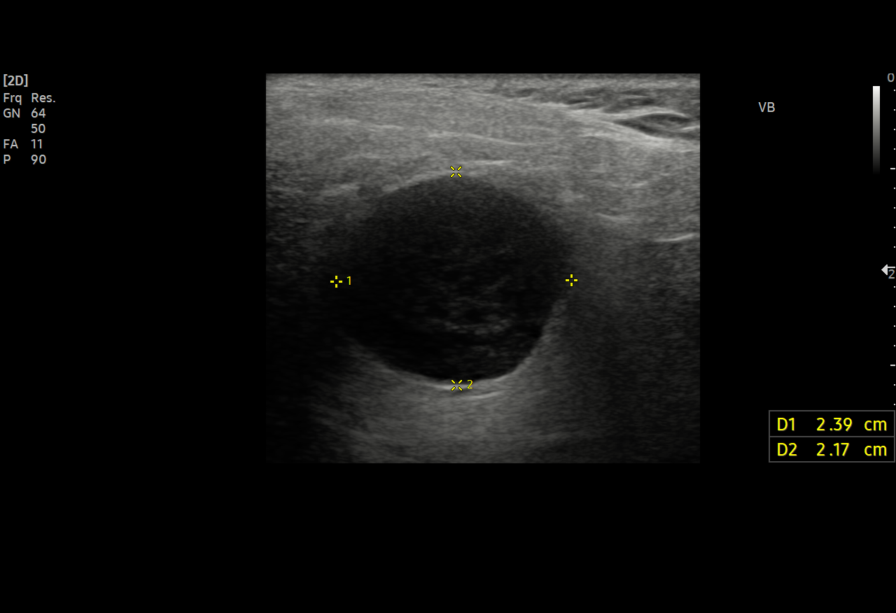
[im 5/7]
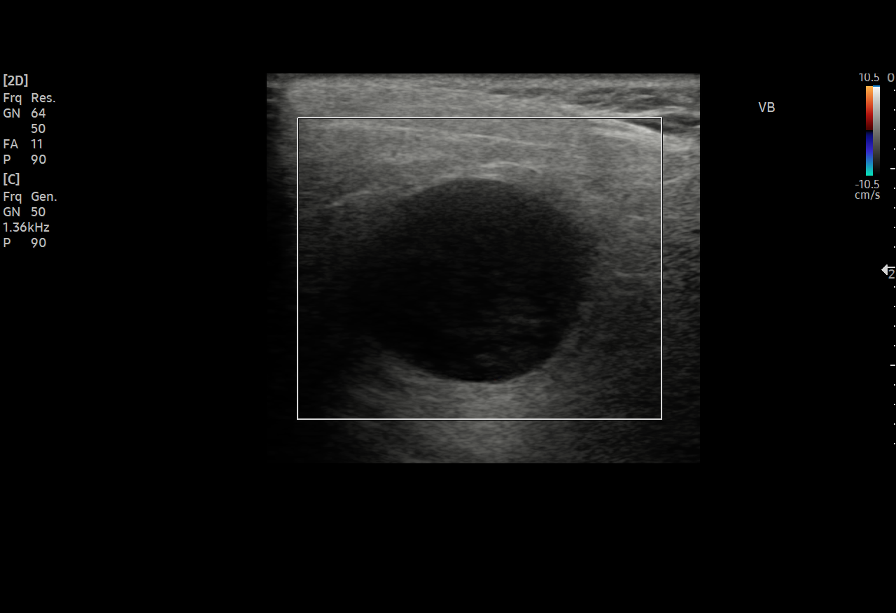
[im 6/7]
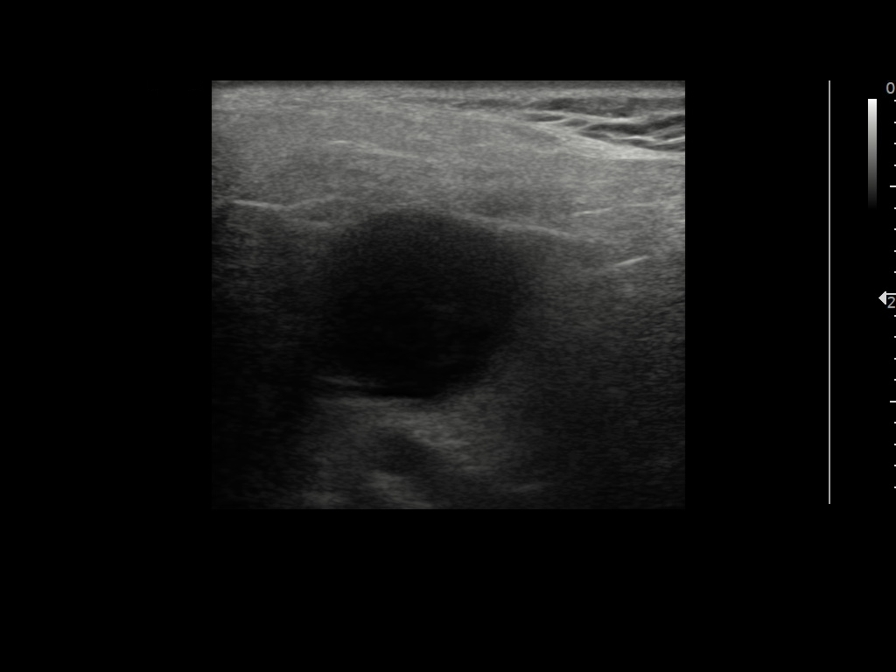
[im 7/7]
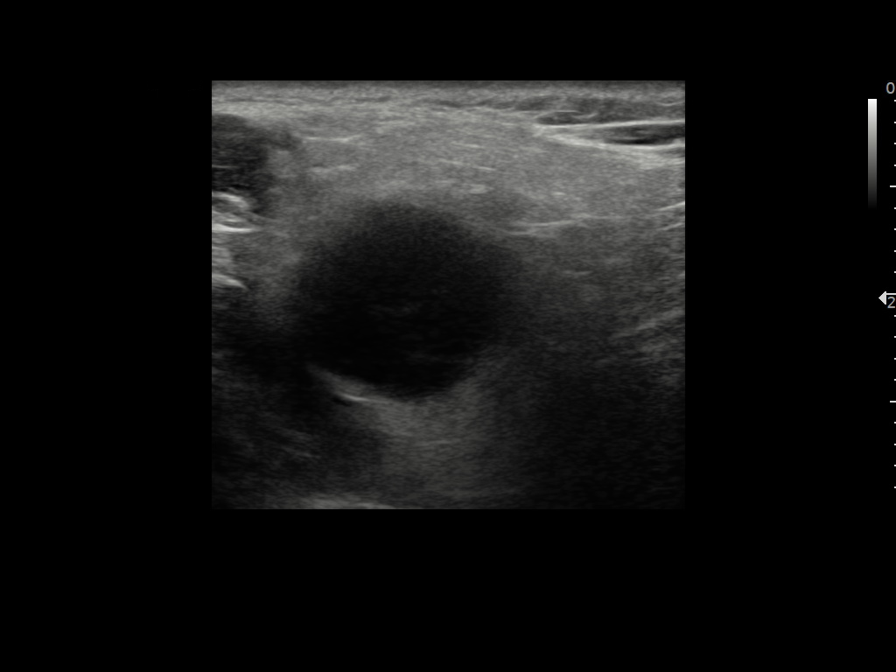

[7 of 7 positions shown; findings below may reference images not displayed]

FINDINGS: Targeted grayscale and Doppler imaging of a right parotid lesion was
performed. Ultrasound demonstrates a well-circumscribed round mass
measuring 2.4 x 2.2 x 2.2 cm. Lesion is positioned within the deep
aspect of the right parotid gland. Lesion demonstrates a hypoechoic
echotexture with scattered low-level internal echoes. Lesion is
largely avascular by ultrasound, with no appreciable internal
vascularity by Doppler imaging. Finding is indeterminate, but could
reflect a primary parotid neoplasm or possibly an enlarged abnormal
intraparotid lymph node.

The visualized surrounding glandular and fibromuscular soft tissues
are otherwise unremarkable.
IMPRESSION: 2.4 x 2.2 x 2.2 cm mildly complex hypoechoic mass within the right
parotid gland as above. Finding is indeterminate, with primary
differential considerations including a primary parotid neoplasm
versus an enlarged intraparotid lymph node. Correlation with
dedicated contrast enhanced CT of the neck could be performed for
further evaluation as warranted. Additionally, this lesion would
likely be amenable to ultrasound-guided percutaneous histologic
sampling.
